# Patient Record
Sex: Female | Born: 1940 | Race: White | Hispanic: No | Smoking: Current every day smoker
Health system: Southern US, Community
[De-identification: ages and names within clinical notes are randomized; demographics above are authoritative.]

## PROBLEM LIST (undated history)

## (undated) ENCOUNTER — Emergency Department (HOSPITAL_COMMUNITY): Payer: Medicare Other | Source: Home / Self Care

## (undated) DIAGNOSIS — Z87440 Personal history of urinary (tract) infections: Secondary | ICD-10-CM

## (undated) DIAGNOSIS — K219 Gastro-esophageal reflux disease without esophagitis: Secondary | ICD-10-CM

## (undated) DIAGNOSIS — L921 Necrobiosis lipoidica, not elsewhere classified: Secondary | ICD-10-CM

## (undated) DIAGNOSIS — K648 Other hemorrhoids: Secondary | ICD-10-CM

## (undated) DIAGNOSIS — H7290 Unspecified perforation of tympanic membrane, unspecified ear: Secondary | ICD-10-CM

## (undated) DIAGNOSIS — M1990 Unspecified osteoarthritis, unspecified site: Secondary | ICD-10-CM

## (undated) DIAGNOSIS — F419 Anxiety disorder, unspecified: Secondary | ICD-10-CM

## (undated) DIAGNOSIS — R55 Syncope and collapse: Secondary | ICD-10-CM

## (undated) DIAGNOSIS — Z872 Personal history of diseases of the skin and subcutaneous tissue: Secondary | ICD-10-CM

## (undated) DIAGNOSIS — K589 Irritable bowel syndrome without diarrhea: Secondary | ICD-10-CM

## (undated) HISTORY — DX: Necrobiosis lipoidica, not elsewhere classified: L92.1

## (undated) HISTORY — PX: VESICOVAGINAL FISTULA CLOSURE W/ TAH: SUR271

## (undated) HISTORY — PX: ABDOMINAL HYSTERECTOMY: SHX81

## (undated) HISTORY — DX: Other hemorrhoids: K64.8

## (undated) HISTORY — DX: Syncope and collapse: R55

## (undated) HISTORY — PX: BUNIONECTOMY: SHX129

## (undated) HISTORY — DX: Anxiety disorder, unspecified: F41.9

## (undated) HISTORY — DX: Unspecified osteoarthritis, unspecified site: M19.90

## (undated) HISTORY — DX: Irritable bowel syndrome without diarrhea: K58.9

## (undated) HISTORY — DX: Personal history of diseases of the skin and subcutaneous tissue: Z87.2

## (undated) HISTORY — DX: Personal history of urinary (tract) infections: Z87.440

## (undated) HISTORY — DX: Unspecified perforation of tympanic membrane, unspecified ear: H72.90

## (undated) HISTORY — DX: Gastro-esophageal reflux disease without esophagitis: K21.9

---

## 1999-01-24 ENCOUNTER — Other Ambulatory Visit: Admission: RE | Admit: 1999-01-24 | Discharge: 1999-01-24 | Payer: Self-pay | Admitting: Internal Medicine

## 2000-03-15 ENCOUNTER — Encounter: Payer: Self-pay | Admitting: Internal Medicine

## 2000-03-15 ENCOUNTER — Encounter: Admission: RE | Admit: 2000-03-15 | Discharge: 2000-03-15 | Payer: Self-pay | Admitting: Internal Medicine

## 2001-03-27 ENCOUNTER — Encounter: Payer: Self-pay | Admitting: Internal Medicine

## 2001-03-27 ENCOUNTER — Encounter: Admission: RE | Admit: 2001-03-27 | Discharge: 2001-03-27 | Payer: Self-pay | Admitting: Internal Medicine

## 2001-04-11 ENCOUNTER — Other Ambulatory Visit: Admission: RE | Admit: 2001-04-11 | Discharge: 2001-04-11 | Payer: Self-pay | Admitting: Internal Medicine

## 2001-10-28 ENCOUNTER — Encounter (INDEPENDENT_AMBULATORY_CARE_PROVIDER_SITE_OTHER): Payer: Self-pay | Admitting: *Deleted

## 2001-10-28 ENCOUNTER — Ambulatory Visit (HOSPITAL_COMMUNITY): Admission: RE | Admit: 2001-10-28 | Discharge: 2001-10-28 | Payer: Self-pay | Admitting: *Deleted

## 2002-02-26 ENCOUNTER — Emergency Department (HOSPITAL_COMMUNITY): Admission: EM | Admit: 2002-02-26 | Discharge: 2002-02-27 | Payer: Self-pay | Admitting: Emergency Medicine

## 2002-04-02 ENCOUNTER — Ambulatory Visit (HOSPITAL_COMMUNITY): Admission: RE | Admit: 2002-04-02 | Discharge: 2002-04-02 | Payer: Self-pay | Admitting: *Deleted

## 2002-04-02 ENCOUNTER — Encounter: Payer: Self-pay | Admitting: Internal Medicine

## 2002-11-20 ENCOUNTER — Other Ambulatory Visit: Admission: RE | Admit: 2002-11-20 | Discharge: 2002-11-20 | Payer: Self-pay | Admitting: Internal Medicine

## 2002-12-25 ENCOUNTER — Encounter: Payer: Self-pay | Admitting: Internal Medicine

## 2002-12-25 ENCOUNTER — Encounter: Admission: RE | Admit: 2002-12-25 | Discharge: 2002-12-25 | Payer: Self-pay | Admitting: Internal Medicine

## 2002-12-31 ENCOUNTER — Ambulatory Visit (HOSPITAL_COMMUNITY): Admission: RE | Admit: 2002-12-31 | Discharge: 2002-12-31 | Payer: Self-pay | Admitting: *Deleted

## 2002-12-31 ENCOUNTER — Encounter (INDEPENDENT_AMBULATORY_CARE_PROVIDER_SITE_OTHER): Payer: Self-pay | Admitting: Specialist

## 2003-07-10 ENCOUNTER — Inpatient Hospital Stay (HOSPITAL_COMMUNITY): Admission: EM | Admit: 2003-07-10 | Discharge: 2003-07-12 | Payer: Self-pay | Admitting: Emergency Medicine

## 2003-07-10 ENCOUNTER — Encounter: Payer: Self-pay | Admitting: *Deleted

## 2003-07-12 ENCOUNTER — Encounter (INDEPENDENT_AMBULATORY_CARE_PROVIDER_SITE_OTHER): Payer: Self-pay | Admitting: Cardiology

## 2004-05-22 ENCOUNTER — Ambulatory Visit (HOSPITAL_COMMUNITY): Admission: RE | Admit: 2004-05-22 | Discharge: 2004-05-22 | Payer: Self-pay | Admitting: Internal Medicine

## 2004-09-25 ENCOUNTER — Ambulatory Visit: Payer: Self-pay | Admitting: Internal Medicine

## 2004-09-26 ENCOUNTER — Ambulatory Visit: Payer: Self-pay | Admitting: Internal Medicine

## 2004-11-14 ENCOUNTER — Ambulatory Visit: Payer: Self-pay | Admitting: Internal Medicine

## 2005-02-23 ENCOUNTER — Ambulatory Visit: Payer: Self-pay | Admitting: Internal Medicine

## 2005-03-23 ENCOUNTER — Ambulatory Visit: Payer: Self-pay | Admitting: Internal Medicine

## 2005-03-30 ENCOUNTER — Ambulatory Visit: Payer: Self-pay | Admitting: Internal Medicine

## 2005-05-24 ENCOUNTER — Ambulatory Visit (HOSPITAL_COMMUNITY): Admission: RE | Admit: 2005-05-24 | Discharge: 2005-05-24 | Payer: Self-pay | Admitting: Internal Medicine

## 2005-08-06 ENCOUNTER — Encounter (INDEPENDENT_AMBULATORY_CARE_PROVIDER_SITE_OTHER): Payer: Self-pay | Admitting: *Deleted

## 2005-08-06 ENCOUNTER — Ambulatory Visit (HOSPITAL_COMMUNITY): Admission: RE | Admit: 2005-08-06 | Discharge: 2005-08-06 | Payer: Self-pay | Admitting: *Deleted

## 2005-08-06 ENCOUNTER — Encounter: Payer: Self-pay | Admitting: Internal Medicine

## 2005-10-08 ENCOUNTER — Encounter: Payer: Self-pay | Admitting: Internal Medicine

## 2006-04-09 ENCOUNTER — Ambulatory Visit: Payer: Self-pay | Admitting: Internal Medicine

## 2006-04-15 ENCOUNTER — Ambulatory Visit: Payer: Self-pay | Admitting: Internal Medicine

## 2007-05-27 ENCOUNTER — Ambulatory Visit (HOSPITAL_COMMUNITY): Admission: RE | Admit: 2007-05-27 | Discharge: 2007-05-27 | Payer: Self-pay | Admitting: Orthopedic Surgery

## 2007-07-21 ENCOUNTER — Ambulatory Visit: Payer: Self-pay | Admitting: Internal Medicine

## 2007-07-21 LAB — CONVERTED CEMR LAB
Alkaline Phosphatase: 70 units/L (ref 39–117)
BUN: 7 mg/dL (ref 6–23)
Basophils Absolute: 0.1 10*3/uL (ref 0.0–0.1)
Basophils Relative: 0.9 % (ref 0.0–1.0)
CO2: 32 meq/L (ref 19–32)
Chloride: 106 meq/L (ref 96–112)
Creatinine, Ser: 0.8 mg/dL (ref 0.4–1.2)
Eosinophils Absolute: 0.2 10*3/uL (ref 0.0–0.6)
Glucose, Bld: 107 mg/dL — ABNORMAL HIGH (ref 70–99)
HCT: 43.4 % (ref 36.0–46.0)
HDL: 43.8 mg/dL (ref >39.0)
Ketones, ur: NEGATIVE mg/dL
Lymphocytes Relative: 17.8 % (ref 12.0–46.0)
MCHC: 34 g/dL (ref 30.0–36.0)
MCV: 87.7 fL (ref 78.0–100.0)
Monocytes Absolute: 0.4 10*3/uL (ref 0.2–0.7)
Monocytes Relative: 5.8 % (ref 3.0–11.0)
Neutrophils Relative %: 72.6 % (ref 43.0–77.0)
Platelets: 368 10*3/uL (ref 150–400)
RBC: 4.95 M/uL (ref 3.87–5.11)
RDW: 13.1 % (ref 11.5–14.6)
Specific Gravity, Urine: 1.015 (ref 1.000–1.03)
TSH: 1.12 microintl units/mL (ref 0.35–5.50)
Total Bilirubin: 0.8 mg/dL (ref 0.3–1.2)
WBC: 6.6 10*3/uL (ref 4.5–10.5)
pH: 6 (ref 5.0–8.0)

## 2007-07-24 ENCOUNTER — Ambulatory Visit: Payer: Self-pay | Admitting: Internal Medicine

## 2007-07-24 ENCOUNTER — Encounter: Payer: Self-pay | Admitting: Internal Medicine

## 2007-07-25 DIAGNOSIS — Z872 Personal history of diseases of the skin and subcutaneous tissue: Secondary | ICD-10-CM

## 2007-07-25 DIAGNOSIS — L409 Psoriasis, unspecified: Secondary | ICD-10-CM

## 2007-07-25 DIAGNOSIS — K219 Gastro-esophageal reflux disease without esophagitis: Secondary | ICD-10-CM

## 2007-07-25 DIAGNOSIS — L988 Other specified disorders of the skin and subcutaneous tissue: Secondary | ICD-10-CM

## 2007-07-25 DIAGNOSIS — M1990 Unspecified osteoarthritis, unspecified site: Secondary | ICD-10-CM | POA: Insufficient documentation

## 2007-07-25 DIAGNOSIS — M810 Age-related osteoporosis without current pathological fracture: Secondary | ICD-10-CM | POA: Insufficient documentation

## 2007-07-25 HISTORY — DX: Personal history of diseases of the skin and subcutaneous tissue: Z87.2

## 2007-07-31 ENCOUNTER — Encounter: Admission: RE | Admit: 2007-07-31 | Discharge: 2007-07-31 | Payer: Self-pay | Admitting: Internal Medicine

## 2008-03-02 ENCOUNTER — Telehealth: Payer: Self-pay | Admitting: Internal Medicine

## 2008-03-02 ENCOUNTER — Ambulatory Visit: Payer: Self-pay | Admitting: Internal Medicine

## 2008-03-02 DIAGNOSIS — F411 Generalized anxiety disorder: Secondary | ICD-10-CM | POA: Insufficient documentation

## 2008-05-25 ENCOUNTER — Telehealth: Payer: Self-pay | Admitting: Internal Medicine

## 2008-05-28 ENCOUNTER — Encounter: Payer: Self-pay | Admitting: Internal Medicine

## 2008-07-14 ENCOUNTER — Ambulatory Visit: Payer: Self-pay | Admitting: Internal Medicine

## 2008-07-14 ENCOUNTER — Telehealth: Payer: Self-pay | Admitting: Internal Medicine

## 2008-07-14 ENCOUNTER — Ambulatory Visit (HOSPITAL_COMMUNITY): Admission: RE | Admit: 2008-07-14 | Discharge: 2008-07-14 | Payer: Self-pay | Admitting: Internal Medicine

## 2008-07-14 LAB — CONVERTED CEMR LAB
AST: 10 units/L (ref 0–37)
Albumin: 3.7 g/dL (ref 3.5–5.2)
Basophils Absolute: 0 10*3/uL (ref 0.0–0.1)
Basophils Relative: 0.1 % (ref 0.0–3.0)
Bilirubin, Direct: 0.2 mg/dL (ref 0.0–0.3)
CO2: 32 meq/L (ref 19–32)
Crystals: NEGATIVE
GFR calc Af Amer: 92 mL/min
Glucose, Bld: 99 mg/dL (ref 70–99)
HDL: 50 mg/dL (ref >39.0)
Hemoglobin: 14 g/dL (ref 12.0–15.0)
Lymphocytes Relative: 21.8 % (ref 12.0–46.0)
MCHC: 33.5 g/dL (ref 30.0–36.0)
MCV: 89.8 fL (ref 78.0–100.0)
Monocytes Absolute: 0.4 10*3/uL (ref 0.1–1.0)
Neutro Abs: 5.9 10*3/uL (ref 1.4–7.7)
Neutrophils Relative %: 71.1 % (ref 43.0–77.0)
Platelets: 394 10*3/uL (ref 150–400)
Potassium: 5.5 meq/L — ABNORMAL HIGH (ref 3.5–5.1)
RDW: 12.8 % (ref 11.5–14.6)
Sodium: 143 meq/L (ref 135–145)
Specific Gravity, Urine: 1.01 (ref 1.000–1.03)
Total Protein, Urine: NEGATIVE mg/dL
Triglycerides: 64 mg/dL (ref 0–149)
Urine Glucose: NEGATIVE mg/dL
VLDL: 13 mg/dL (ref 0–40)

## 2008-07-26 ENCOUNTER — Ambulatory Visit: Payer: Self-pay | Admitting: Internal Medicine

## 2008-09-08 ENCOUNTER — Telehealth: Payer: Self-pay | Admitting: Internal Medicine

## 2009-04-18 ENCOUNTER — Telehealth: Payer: Self-pay | Admitting: Internal Medicine

## 2009-04-21 ENCOUNTER — Ambulatory Visit: Payer: Self-pay | Admitting: Internal Medicine

## 2009-04-21 DIAGNOSIS — R55 Syncope and collapse: Secondary | ICD-10-CM | POA: Insufficient documentation

## 2009-04-21 HISTORY — DX: Syncope and collapse: R55

## 2009-04-22 ENCOUNTER — Observation Stay (HOSPITAL_COMMUNITY): Admission: EM | Admit: 2009-04-22 | Discharge: 2009-04-24 | Payer: Self-pay | Admitting: Emergency Medicine

## 2009-04-22 ENCOUNTER — Ambulatory Visit: Payer: Self-pay | Admitting: Internal Medicine

## 2009-04-22 ENCOUNTER — Telehealth: Payer: Self-pay | Admitting: Family Medicine

## 2009-04-23 ENCOUNTER — Encounter: Payer: Self-pay | Admitting: Internal Medicine

## 2009-04-23 ENCOUNTER — Ambulatory Visit: Payer: Self-pay | Admitting: Internal Medicine

## 2009-04-24 ENCOUNTER — Telehealth (INDEPENDENT_AMBULATORY_CARE_PROVIDER_SITE_OTHER): Payer: Self-pay | Admitting: *Deleted

## 2009-04-24 ENCOUNTER — Encounter: Payer: Self-pay | Admitting: Internal Medicine

## 2009-04-28 ENCOUNTER — Encounter: Payer: Self-pay | Admitting: Internal Medicine

## 2009-04-28 ENCOUNTER — Ambulatory Visit: Payer: Self-pay

## 2009-05-03 ENCOUNTER — Telehealth (INDEPENDENT_AMBULATORY_CARE_PROVIDER_SITE_OTHER): Payer: Self-pay | Admitting: *Deleted

## 2009-05-03 ENCOUNTER — Telehealth: Payer: Self-pay | Admitting: Internal Medicine

## 2009-05-04 ENCOUNTER — Encounter: Payer: Self-pay | Admitting: Internal Medicine

## 2009-05-04 ENCOUNTER — Encounter: Payer: Self-pay | Admitting: Cardiovascular Disease

## 2009-05-04 ENCOUNTER — Ambulatory Visit: Payer: Self-pay

## 2009-05-11 ENCOUNTER — Telehealth: Payer: Self-pay | Admitting: Cardiovascular Disease

## 2009-05-12 ENCOUNTER — Ambulatory Visit: Payer: Self-pay | Admitting: Internal Medicine

## 2009-05-12 ENCOUNTER — Telehealth: Payer: Self-pay | Admitting: Internal Medicine

## 2009-05-12 LAB — CONVERTED CEMR LAB
Hemoglobin, Urine: NEGATIVE
Ketones, ur: NEGATIVE mg/dL
Specific Gravity, Urine: <=1.005 (ref 1.000–1.030)
Total Protein, Urine: NEGATIVE mg/dL
Urine Glucose: NEGATIVE mg/dL
pH: 6.5 (ref 5.0–8.0)

## 2009-05-17 ENCOUNTER — Telehealth: Payer: Self-pay | Admitting: Internal Medicine

## 2009-05-23 ENCOUNTER — Ambulatory Visit: Payer: Self-pay | Admitting: Internal Medicine

## 2009-05-25 ENCOUNTER — Telehealth (INDEPENDENT_AMBULATORY_CARE_PROVIDER_SITE_OTHER): Payer: Self-pay | Admitting: *Deleted

## 2009-05-27 ENCOUNTER — Ambulatory Visit: Payer: Self-pay | Admitting: Internal Medicine

## 2009-05-27 DIAGNOSIS — R002 Palpitations: Secondary | ICD-10-CM | POA: Insufficient documentation

## 2009-06-02 ENCOUNTER — Ambulatory Visit (HOSPITAL_COMMUNITY): Admission: RE | Admit: 2009-06-02 | Discharge: 2009-06-02 | Payer: Self-pay | Admitting: Internal Medicine

## 2009-06-06 ENCOUNTER — Telehealth: Payer: Self-pay | Admitting: Internal Medicine

## 2009-06-09 ENCOUNTER — Telehealth: Payer: Self-pay | Admitting: Internal Medicine

## 2009-06-30 ENCOUNTER — Ambulatory Visit: Payer: Self-pay | Admitting: Internal Medicine

## 2009-06-30 DIAGNOSIS — R197 Diarrhea, unspecified: Secondary | ICD-10-CM

## 2009-07-07 ENCOUNTER — Encounter: Payer: Self-pay | Admitting: Internal Medicine

## 2009-07-07 ENCOUNTER — Ambulatory Visit: Payer: Self-pay | Admitting: Internal Medicine

## 2009-07-27 ENCOUNTER — Telehealth: Payer: Self-pay | Admitting: Internal Medicine

## 2009-07-28 ENCOUNTER — Ambulatory Visit: Payer: Self-pay | Admitting: Internal Medicine

## 2009-08-18 ENCOUNTER — Ambulatory Visit: Payer: Self-pay | Admitting: Internal Medicine

## 2009-08-18 DIAGNOSIS — K589 Irritable bowel syndrome without diarrhea: Secondary | ICD-10-CM

## 2009-08-18 HISTORY — DX: Irritable bowel syndrome, unspecified: K58.9

## 2010-02-06 ENCOUNTER — Telehealth: Payer: Self-pay | Admitting: Internal Medicine

## 2010-02-07 ENCOUNTER — Telehealth (INDEPENDENT_AMBULATORY_CARE_PROVIDER_SITE_OTHER): Payer: Self-pay | Admitting: *Deleted

## 2010-02-21 ENCOUNTER — Encounter: Payer: Self-pay | Admitting: Internal Medicine

## 2010-02-21 ENCOUNTER — Ambulatory Visit (HOSPITAL_COMMUNITY): Admission: RE | Admit: 2010-02-21 | Discharge: 2010-02-21 | Payer: Self-pay | Admitting: Internal Medicine

## 2010-02-21 LAB — HM MAMMOGRAPHY: HM Mammogram: NORMAL

## 2010-02-23 ENCOUNTER — Telehealth: Payer: Self-pay | Admitting: Internal Medicine

## 2010-03-13 ENCOUNTER — Encounter: Payer: Self-pay | Admitting: Internal Medicine

## 2010-03-20 ENCOUNTER — Ambulatory Visit: Payer: Self-pay | Admitting: Internal Medicine

## 2010-03-20 LAB — CONVERTED CEMR LAB
ALT: 11 units/L (ref 0–35)
AST: 14 units/L (ref 0–37)
Alkaline Phosphatase: 61 units/L (ref 39–117)
Basophils Relative: 0.9 % (ref 0.0–3.0)
Bilirubin Urine: NEGATIVE
CO2: 31 meq/L (ref 19–32)
Chloride: 103 meq/L (ref 96–112)
Creatinine, Ser: 0.7 mg/dL (ref 0.4–1.2)
Eosinophils Absolute: 0.3 10*3/uL (ref 0.0–0.7)
Eosinophils Relative: 3.7 % (ref 0.0–5.0)
GFR calc non Af Amer: 89.93 mL/min (ref >60)
LDL Cholesterol: 103 mg/dL — ABNORMAL HIGH (ref 0–99)
Lymphocytes Relative: 24.5 % (ref 12.0–46.0)
Lymphs Abs: 2.1 10*3/uL (ref 0.7–4.0)
MCHC: 34 g/dL (ref 30.0–36.0)
MCV: 88.3 fL (ref 78.0–100.0)
Platelets: 396 10*3/uL (ref 150.0–400.0)
Potassium: 5 meq/L (ref 3.5–5.1)
RBC: 4.73 M/uL (ref 3.87–5.11)
Sodium: 139 meq/L (ref 135–145)
Total CHOL/HDL Ratio: 3
Total Protein: 7.2 g/dL (ref 6.0–8.3)
Triglycerides: 48 mg/dL (ref 0.0–149.0)
Urine Glucose: NEGATIVE mg/dL
Urobilinogen, UA: 0.2 (ref 0.0–1.0)
VLDL: 9.6 mg/dL (ref 0.0–40.0)
WBC: 8.8 10*3/uL (ref 4.5–10.5)

## 2010-03-23 ENCOUNTER — Ambulatory Visit: Payer: Self-pay | Admitting: Internal Medicine

## 2010-05-03 ENCOUNTER — Telehealth: Payer: Self-pay | Admitting: Internal Medicine

## 2010-05-04 ENCOUNTER — Ambulatory Visit: Payer: Self-pay | Admitting: Internal Medicine

## 2010-05-17 ENCOUNTER — Ambulatory Visit: Payer: Self-pay | Admitting: Internal Medicine

## 2010-06-08 ENCOUNTER — Encounter: Payer: Self-pay | Admitting: Internal Medicine

## 2010-07-24 ENCOUNTER — Encounter: Payer: Self-pay | Admitting: Internal Medicine

## 2010-11-28 NOTE — Assessment & Plan Note (Signed)
Summary: STOMACH UP SET/NWS   Vital Signs:  Patient profile:   70 year old female Height:      67 inches Weight:      121 pounds BMI:     19.02 O2 Sat:      95 % on Room air Temp:     97.5 degrees F oral Pulse rate:   103 / minute BP sitting:   136 / 70  (left arm) Cuff size:   regular  Vitals Entered By: Bill Salinas CMA (May 17, 2010 8:57 AM)  O2 Flow:  Room air CC: pt here with c/o chronic diarrhea, nausea and loss of appetite./ ab Comments Pt is due for tetanus shot. She has never had pneumonia or shingles vaccine/ ab   Primary Care Provider:  Illene Regulus, MD  CC:  pt here with c/o chronic diarrhea and nausea and loss of appetite./ ab.  History of Present Illness: Patient presents for continues weight loss, which is profound. She had called 2 weeks ago for stomach upset and was started on Zantac. She did develop diarrhea- 4-5 loose stools during the day. She stopped Zantac and started Peptobismal and mylanta. Calmed her stomach and the loose stools stopped. She describes a thick mucus in her throat in the AM that did seem to respond to allegra.   Reviewed chart - she has seen Dr. 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 She has been ruled out for celiac disease. She was taken off of PPIs as potential exacerbating factor. There is a working diagnosis of IBS. She has been seeing Dr. Wyline Copas for psychiatry.   Current Medications (verified): 1)  Clotrimazole-Betamethasone 1-0.05 %  Crea (Clotrimazole-Betamethasone) .... Two Times A Day As Needed For Exzema Ears 2)  Premarin 0.3 Mg  Tabs (Estrogens Conjugated) .... Once Daily 3)  Naftin-Mp 1 %  Crea (Naftifine Hcl) .... Apply To Feet As Needed 4)  Salex 6 %  Lotn (Salicylic Acid) .... Apply To Legs As Needed 5)  Tranxene-T 7.5 Mg Tabs (Clorazepate Dipotassium) .... 1/2 Tablet By Mouth At Bedtime 6)  Pepto-Bismol 524 Mg/61ml Susp (Bismuth Subsalicylate) .... As Needed 7)  Imodium  Advanced 2-125 Mg Tabs (Loperamide-Simethicone) .... As Needed 8)  Mylanta Gas Relief Maximum Str 125 Mg Caps (Simethicone) .... Use As Needed  Allergies (verified): 1)  ! Tetracycline  Past History:  Past Medical History: Last updated: 08/18/2009 UCD heart murmur-childhhod ruptured eardrum-childhood hemorrhoids-internal hx of UTI necrobiosis lipoidica GERD Osteoarthritis Osteoporosis anxiety postural syncope IBS  Past Surgical History: Last updated: 05/26/2009 Hysterectomy with BSO-'82 bunionectomy with hammertoe correction -'93  Family History: Last updated: 07/24/2009 mother-arthirits Father-died of ruptured AAA paternal GF-MI paternal kinship positive for CAD, DM No FH of Colon Cancer:  Social History: Last updated: 2009/07/24 divorced after 17 yrs marriage 1 daughter, 1 son with mild mental handicap who lives with her 2 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  Alcohol Use - no Daily Caffeine Use: 2 daily   Review of Systems       The patient complains of hemoptysis, severe indigestion/heartburn, and depression.  The patient denies anorexia, fever, weight loss, weight gain, chest pain, syncope, dyspnea on exertion, prolonged cough, hematuria, muscle weakness, transient blindness, and difficulty walking.    Physical Exam  General:  Very thin white female in no acute distress Head:  normocephalic and atraumatic.   Eyes:  C&S clear Neck:  supple.   Chest Wall:  no deformities and  no tenderness.   Lungs:  normal respiratory effort.   Heart:  normal rate and regular rhythm.   Abdomen:  scapoid, BS + x 4 quadrants, no guarding or rebound, no hepatomegaly, no tenderness Msk:  normal ROM and no joint tenderness.   Neurologic:  alert & oriented X3, cranial nerves II-XII intact, strength normal in all extremities, and gait normal.     Impression & Recommendations:  Problem # 1:  IRRITABLE BOWEL SYNDROME (ICD-564.1) Patient with  continued problems with loss of appetite and variable bowel habit. No acute pain and no new symptoms. Concerned for the psychologic overlay as on-going cause for some of her symptoms. Her weight is down 11 lbs since July '10  Plan - D/c pepto, take mylanta before each meal          nutrition - some kind of supplement: e.g. ensure, boost, or carnation instant breakfast          follow-up with her psychiatrist (and request he send records or notes)  Complete Medication List: 1)  Clotrimazole-betamethasone 1-0.05 % Crea (Clotrimazole-betamethasone) .... Two times a day as needed for exzema ears 2)  Premarin 0.3 Mg Tabs (Estrogens conjugated) .... Once daily 3)  Naftin-mp 1 % Crea (Naftifine hcl) .... Apply to feet as needed 4)  Salex 6 % Lotn (Salicylic acid) .... Apply to legs as needed 5)  Tranxene-t 7.5 Mg Tabs (Clorazepate dipotassium) .... 1/2 tablet by mouth at bedtime 6)  Pepto-bismol 524 Mg/61ml Susp (Bismuth subsalicylate) .... As needed 7)  Imodium Advanced 2-125 Mg Tabs (Loperamide-simethicone) .... As needed 8)  Mylanta Gas Relief Maximum Str 125 Mg Caps (Simethicone) .... Use as needed

## 2010-11-28 NOTE — Letter (Signed)
    [facility] [address] [place], [place]  [address] Phone: [phone]      Mar 13, 2010   [facility] [address] [place], [place] [address]  RE:  LAB RESULTS  Dear  [name],  The following is an interpretation of your most recent lab tests.  Please take note of any instructions provided or changes to medications that have resulted from your lab work.    Bone density study reveals osteoporosis at hips, essentially unchanged from '09 study.    Plan is to continue premarin and to take calcium 1200mg   daily and vitamin D [id number] international units daily   Sincerely Yours,    Jacques Navy MD

## 2010-11-28 NOTE — Letter (Signed)
Summary: 05-24-09 to 06-08-10/[facility]  05-24-09 to 06-08-10/[facility]   Imported By: Sherian Rein 06/13/2010 15:07:12  _____________________________________________________________________  External Attachment:    Type:   Image     Comment:   External Document

## 2010-11-28 NOTE — Progress Notes (Signed)
Summary: [facility] pt - [facility] TOMORROW  Phone Note Call from Patient   Summary of Call: Pt called concerned about nausea and weakness. C/o recent weight loss but has spoken w/PCP recently regarding this. Pt feels hungry but food has caused nausea. She is keeping a bland diet and has been using pepto bismol. Advised per last office visit to try H2 blocker. She has not done this but will try today. Also will continue bland small frequent meals, clear liquids and aviod dairy and high acid foods/liquids. Scheduled for office visit tomorow w/Dr Felicity Coyer. Pt will call office w/any changes if symptoms or further concerns prior to office visit.  Initial call taken by: Lamar Sprinkles, CMA,  May 03, 2010 12:09 PM  Follow-up for Phone Call        noted, agree as advised - will f/u at OV tomorrow Follow-up by: Newt Lukes MD,  May 03, 2010 12:35 PM

## 2010-11-28 NOTE — Progress Notes (Signed)
Summary: BONE DENSITY   Phone Note Call from Patient   Summary of Call: Patient is requesting referral for bone density. Last was 06/2008, report reccomends repeat in 2 years.  Initial call taken by: Lamar Sprinkles, CMA,  February 06, 2010 12:04 PM  Follow-up for Phone Call        ok for DXA. Schedullers notified Follow-up by: Jacques Navy MD,  February 07, 2010 5:38 AM

## 2010-11-28 NOTE — Progress Notes (Signed)
Summary: REFERRAL  Phone Note Call from Patient   Summary of Call: Pt [facility] only needs referral faxed to [phone] Initial call taken by: Lamar Sprinkles, CMA,  February 07, 2010 5:39 PM  Follow-up for Phone Call        [facility] notified Follow-up by: Jacques Navy MD,  February 08, 2010 5:05 AM  Additional Follow-up for Phone Call Additional follow up Details #1::          faxed  signed referral [phone] Shelbie Proctor  February 10, 2010 3:03 PM

## 2010-11-28 NOTE — Letter (Signed)
Summary: Ollen Gross PHD  Ollen Gross PHD   Imported By: Lennie Odor 08/10/2010 14:46:09  _____________________________________________________________________  External Attachment:    Type:   Image     Comment:   External Document

## 2010-11-28 NOTE — Progress Notes (Signed)
    Preventive Care Screening  Bone Density:    Date:  02/21/2010    Results:  abnormal

## 2010-11-28 NOTE — Assessment & Plan Note (Signed)
Summary: cpx-lb   Vital Signs:  Patient profile:   70 year old female Height:      67 inches Weight:      126 pounds BMI:     19.81 O2 Sat:      95 % on Room air Temp:     96.8 degrees F oral Pulse rate:   102 / minute BP sitting:   120 / 80  (left arm) Cuff size:   regular  Vitals Entered By: Bill Salinas CMA (Mar 23, 2010 10:02 AM)  O2 Flow:  Room air CC: CPX/ pt needs refills on meds/ ab   Primary Care Provider:  Illene Regulus, MD  CC:  CPX/ pt needs refills on meds/ ab.  History of Present Illness: 7 lbs weight loss: loss of appetite, loss of taste. She has started a multi-vit. She does report diarrhea has resolved with stopping nexium. She does [place] occasional mylanta. She had colonoscopy in September '09 - diagnosed with lymphocytic colitis. she never did need prednisone treatment and the diarrhea did resolve.   She still sees psychiatrist - Dr. Ilsa Iha, at [facility] and he is prescribing tranxene - 7.5 mg at bedtime and 1/2 tab as needed.  Reviewed Cardiology notes. There was a concern on  2D ehco of enlargement of the aortic root. MRA was performed Oct '10 - aortic root 3.3 cm - normal. The rest of the aorta was normal. Dr. Johney Frame felt there was no need for further evaluation of syncope.   Derm - a skin change on the foot at the side of the foot and heel. SHe does get a good response to Naftan, prescribed by dermatologist. She has eczema of the ear canal and uses  clogtrimazole/betamethazone as needed. 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 She is having alopecia and change in scalp texture. She questions if this is hair coloring. She did call Dr. Venancio Poisson about this - Rx for fluocinonide topical 0.005% soln to scalp. She treated this x 3 and it seems to be better. She does have a persistent "skin tag" vs benign mole that had been treated but did not resolve.    she c/o facial pain left involving zygom,  periauricular area. 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   Current Medications (verified): 1)  Clotrimazole-Betamethasone 1-0.05 %  Crea (Clotrimazole-Betamethasone) .... Two Times A Day As Needed For Exzema Ears 2)  Premarin 0.3 Mg  Tabs (Estrogens Conjugated) .... Once Daily 3)  Naftin-Mp 1 %  Crea (Naftifine Hcl) .... Apply To Feet As Needed 4)  Salex 6 %  Lotn (Salicylic Acid) .... Apply To Legs As Needed 5)  Tranxene-T 7.5 Mg Tabs (Clorazepate Dipotassium) .Marland Kitchen.. 1 Tablet By Mouth At Bedtime 6)  Pepto-Bismol 524 Mg/11ml Susp (Bismuth Subsalicylate) .... As Needed 7)  Imodium Advanced 2-125 Mg Tabs (Loperamide-Simethicone) .... As Needed 8)  Mylanta Gas Relief Maximum Str 125 Mg Caps (Simethicone) .... Use As Needed  Allergies (verified): 1)  ! Tetracycline  Past History:  Past Medical History: Last updated: 08/18/2009 UCD heart murmur-childhhod ruptured eardrum-childhood hemorrhoids-internal hx of UTI necrobiosis lipoidica GERD Osteoarthritis Osteoporosis anxiety postural syncope IBS  Past Surgical History: Last updated: 05/26/2009 Hysterectomy with BSO-'82 bunionectomy with hammertoe correction -'93  Family History: Last updated: 07-13-09 mother-arthirits Father-died of ruptured AAA paternal GF-MI paternal kinship positive for CAD, DM No FH of Colon Cancer:  Social History: Last updated: July 13, 2009 divorced after 17  yrs marriage 1 daughter, 1 son with mild mental handicap who lives with her 2 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  Alcohol Use - no Daily Caffeine Use: 2 daily   Risk Factors: Alcohol Use: <1 (07/24/2007) Caffeine Use: 2 (07/26/2008) Exercise: no (07/26/2008)  Risk Factors: Smoking Status: current (06/30/2009) Packs/Day: 1 (07/24/2007)  Review of Systems  The patient denies anorexia, fever, weight loss, weight gain, vision loss, decreased hearing, hoarseness, chest pain, syncope,  dyspnea on exertion, peripheral edema, prolonged cough, hemoptysis, abdominal pain, hematochezia, severe indigestion/heartburn, incontinence, genital sores, muscle weakness, suspicious skin lesions, difficulty walking, unusual weight change, abnormal bleeding, angioedema, and breast masses.    Physical Exam  General:  alert and well-developed, underweight white female. A bit anxious.   Head:  normocephalic, atraumatic, and no abnormalities observed.   Eyes:  vision grossly intact, pupils equal, pupils round, and corneas and lenses clear.   Ears:  R ear normal, L ear normal, and no external deformities.   Nose:  no external deformity and no external erythema.   Mouth:  good dentition, no gingival abnormalities, no dental plaque, and pharynx pink and moist.   Neck:  supple, full ROM, no thyromegaly, and no thyroid nodules or tenderness.   Chest Wall:  no deformities and no tenderness.   Breasts:  skin/areolae normal, no masses, no abnormal thickening, no nipple discharge, and no tenderness.   Lungs:  normal respiratory effort, no intercostal retractions, no dullness, no crackles, and no wheezes.   Heart:  normal rate, regular rhythm, no murmur, no gallop, no JVD, physiological split S2, no lifts, and no thrills.   Abdomen:  soft, non-tender, normal bowel sounds, no masses, no guarding, no rigidity, no abdominal hernia, no hepatomegaly, and no splenomegaly.   Genitalia:  deferred Msk:  normal ROM, no joint tenderness, no joint swelling, no redness over joints, and no joint instability.   Pulses:  2+ radial pulses, 1+ DP Extremities:  No clubbing, cyanosis, edema, or deformity noted with normal full range of motion of all joints.   Neurologic:  alert & oriented X3, cranial nerves II-XII intact, strength normal in all extremities, gait normal, and DTRs symmetrical and normal.   Skin:  turgor normal, color normal, no rashes, no petechiae, and no ulcerations.   Cervical Nodes:  no anterior cervical  adenopathy and no posterior cervical adenopathy.   Axillary Nodes:  no R axillary adenopathy and no L axillary adenopathy.   Inguinal Nodes:  no R inguinal adenopathy and no L inguinal adenopathy.   Psych:  Oriented X3, memory intact for recent and remote, normally interactive, good eye contact, and not anxious appearing.     Impression & Recommendations:  Problem # 1:  IRRITABLE BOWEL SYNDROME (ICD-564.1) Appears to be stable with no complaints today of problems.  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 Patient had a 3.3 cm aortic root - no follow-up needed. She had an otherwise normal aorta without aneurysm. No need for further follow-up.  Problem # 3:  SYNCOPE (ICD-780.2) See hospital d/c. No need for further eval.   Plan - eat regular meals           keep well hydrated, especially when working in the garden on hot days.   Problem # 4:  ANXIETY STATE, UNSPECIFIED (ICD-300.00) Appears to be stable on low dose tranxene. Her medications are managed by Dr. Ilsa Iha.  Her  updated medication list for this problem includes:    Tranxene-t 7.5 Mg Tabs (Clorazepate dipotassium) .Marland Kitchen... 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 This problem is stable as long as she uses her salix.  Problem # 6:  ECZEMA, HX OF (ICD-V13.3) Stable on topical steroid cream. She should return to dermatology as needed.  Problem # 7:  OSTEOPOROSIS (ICD-733.00) Reviewed DXAQ from April 26th '11: right femur score -2.7 (12.6 '09), left femur -3.0 (-2.8 '09): mild progressive disease.  Plan - continue premarin           calcium 11200mg  daily and Vit D [id number] international units daily.  Problem # 8:  OSTEOARTHRITIS (ICD-715.90) stable with no active inflammation of joints.  Problem # 9:  GERD (ICD-530.81) Stable with patient  voicing no active complaints. she may use over the counter H2 blockers, e.g. ranitidine, as needed.   Problem # 10:  Preventive Health Care (ICD-V70.0) Normal exam today. Lab results are very stable including an LDL (bad) cholesterol of 103, normal blood sugar. Last mammogram May 23rd '11 - ok; last colonoscopy Sept 9th'10 no polyps or premalignant lesions - due for followup 2015. She is a candidate for immunizations: tetnus, pneumonia vaccine and shingles vaccine. She remains very active. Although anxious she does not appear to be suffering depression.   In summary - a pleasant woman who appears medically stable. Refill Rxs provided. Her weight loss does not appear pathologic - she is encouraged to take in extra calories, ie.  milkshakes or ice-cream or other calorie supplement daily. She will return as needed.   Complete Medication List: 1)  Clotrimazole-betamethasone 1-0.05 % Crea (Clotrimazole-betamethasone) .... Two times a day as needed for exzema ears 2)  Premarin 0.3 Mg Tabs (Estrogens conjugated) .... Once daily 3)  Naftin-mp 1 % Crea (Naftifine hcl) .... Apply to feet as needed 4)  Salex 6 % Lotn (Salicylic acid) .... Apply to legs as needed 5)  Tranxene-t 7.5 Mg Tabs (Clorazepate dipotassium) .Marland Kitchen.. 1 tablet by mouth at bedtime 6)  Pepto-bismol 524 Mg/58ml Susp (Bismuth subsalicylate) .... As needed 7)  Imodium Advanced 2-125 Mg Tabs (Loperamide-simethicone) .... As needed 8)  Mylanta Gas Relief Maximum Str 125 Mg Caps (Simethicone) .... Use as needed  Other Orders: Subsequent annual wellness visit with prevention plan (O1308)  Patient: Earvin Hansen Note: All result statuses are Final unless otherwise noted.  Tests: (1) Lipid Panel (LIPID)   Cholesterol               160 mg/dL                   6-578     ATP III Classification            Desirable:  < 200 mg/dL                    Borderline High:  200 - 239 mg/dL               High:  > = 240 mg/dL   Triglycerides              48.0 mg/dL                  4.6-962.9     Normal:  <150 mg/dL     Borderline High:  528 - 199 mg/dL   HDL  47.50 mg/dL                 >16.10   VLDL Cholesterol          9.6 mg/dL                   9.6-04.5   LDL Cholesterol      [H]  409 mg/dL                   8-11  CHO/HDL Ratio:  CHD Risk                             3                    Men          Women     1/2 Average Risk     3.4          3.3     Average Risk          5.0          4.4     2X Average Risk          9.6          7.1     3X Average Risk          15.0          11.0                           Tests: (2) BMP (METABOL)   Sodium                    139 mEq/L                   135-145   Potassium                 5.0 mEq/L                   3.5-5.1   Chloride                  103 mEq/L                   96-112   Carbon Dioxide            31 mEq/L                    19-32   Glucose                   92 mg/dL                    91-47   BUN                       10 mg/dL                    8-29   Creatinine                0.7 mg/dL                   5.6-2.1   Calcium                   8.8 mg/dL  8.4-10.5   GFR                       89.93 mL/min                >60  Tests: (3) CBC Platelet w/Diff (CBCD)   White Cell Count          8.8 K/uL                    4.5-10.5   Red Cell Count            4.73 Mil/uL                 3.87-5.11   Hemoglobin                14.2 g/dL                   16.1-09.6   Hematocrit                41.7 %                      36.0-46.0   MCV                       88.3 fl                     78.0-100.0   MCHC                      34.0 g/dL                   04.5-40.9   RDW                       13.6 %                      11.5-14.6   Platelet Count            396.0 K/uL                  150.0-400.0   Neutrophil %              65.6 %                      43.0-77.0   Lymphocyte %              24.5 %                      12.0-46.0   Monocyte %                5.3 %                        3.0-12.0   Eosinophils%              3.7 %                       0.0-5.0   Basophils %               0.9 %                       0.0-3.0   Neutrophill Absolute  5.8 K/uL                    1.4-7.7   Lymphocyte Absolute       2.1 K/uL                    0.7-4.0   Monocyte Absolute         0.5 K/uL                    0.1-1.0  Eosinophils, Absolute                             0.3 K/uL                    0.0-0.7   Basophils Absolute        0.1 K/uL                    0.0-0.1  Tests: (4) Hepatic/Liver Function Panel (HEPATIC)   Total Bilirubin           0.4 mg/dL                   0.1-0.2   Direct Bilirubin          0.1 mg/dL                   7.2-5.3   Alkaline Phosphatase      61 U/L                      39-117   AST                       14 U/L                      0-37   ALT                       11 U/L                      0-35   Total Protein             7.2 g/dL                    6.6-4.4   Albumin                   3.8 g/dL                    0.3-4.7  Tests: (5) TSH (TSH)   FastTSH                   1.36 uIU/mL                 0.35-5.50  Tests: (6) UDip w/Micro (URINE)   Color                     Yellow       RANGE:  Yellow;Lt. Yellow   Clarity                   CLEAR                       Clear   Specific Gravity  1.015                       1.000 - 1.030   Urine Ph                  7.0                         5.0-8.0   Protein                   NEGATIVE                    Negative   Urine Glucose             NEGATIVE                    Negative   Ketones                   NEGATIVE                    Negative   Urine Bilirubin           NEGATIVE                    Negative   Blood                     TRACE-INTACT                Negative   Urobilinogen              0.2                         0.0 - 1.0   Leukocyte Esterace        SMALL                       Negative   Nitrite                   NEGATIVE                    Negative   Urine WBC                  3-6/hpf                     0-2/hpf   Urine RBC                 0-2/hpf                     0-2/hpf   Urine Epith               Many(>10/hpf)               Rare(0-4/hpf)   Urine Bacteria            Rare(<10/hpf)               NonePrescriptions: SALEX 6 %  LOTN (SALICYLIC ACID) apply to legs as needed  #4oz x 3   Entered and Authorized by:   Jacques Navy MD   Signed by:   Jacques Navy MD on 03/23/2010   Method used:   Electronically to        [facility]* (retail)  [address]       [place], [place]  [id number]       Ph: [phone]       Fax: [phone]   RxID:   [id number] NAFTIN-MP 1 %  CREA (NAFTIFINE HCL) apply to feet as needed  #15g x 3   Entered and Authorized by:   Jacques Navy MD   Signed by:   Jacques Navy MD on 03/23/2010   Method used:   Electronically to        [facility]* (retail)       [address]       [place], [place]  [address]       Ph: [phone]       Fax: [phone]   RxID:   [id number] PREMARIN 0.3 MG  TABS (ESTROGENS CONJUGATED) once daily  #30 Each x 12   Entered and Authorized by:   Jacques Navy MD   Signed by:   Jacques Navy MD on 03/23/2010   Method used:   Electronically to        [facility]* (retail)       [address]       [place], [place]  [address]       Ph: [phone]       Fax: [phone]   RxID:   [id number] CLOTRIMAZOLE-BETAMETHASONE 1-0.05 %  CREA (CLOTRIMAZOLE-BETAMETHASONE) two times a day as needed for exzema ears  #15g x 3   Entered and Authorized by:   Jacques Navy MD   Signed by:   Jacques Navy MD on 03/23/2010   Method used:   Electronically to        [facility]* (retail)       [address]       [place], [place]  [id number]       Ph: [phone]       Fax: [phone]   RxID:   [id number]    Preventive Care Screening  Mammogram:    Date:  02/21/2010    Results:  normal  bilateral

## 2011-02-05 LAB — DIFFERENTIAL
Basophils Absolute: 0.1 10*3/uL (ref 0.0–0.1)
Basophils Relative: 1 % (ref 0–1)
Eosinophils Absolute: 0.1 10*3/uL (ref 0.0–0.7)
Neutro Abs: 6.2 10*3/uL (ref 1.7–7.7)
Neutrophils Relative %: 70 % (ref 43–77)

## 2011-02-05 LAB — URINALYSIS, ROUTINE W REFLEX MICROSCOPIC
Bilirubin Urine: NEGATIVE
Glucose, UA: NEGATIVE mg/dL
Nitrite: NEGATIVE
Specific Gravity, Urine: 1.012 (ref 1.005–1.030)
pH: 6 (ref 5.0–8.0)

## 2011-02-05 LAB — CBC
HCT: 45.8 % (ref 36.0–46.0)
MCV: 89 fL (ref 78.0–100.0)
Platelets: 364 10*3/uL (ref 150–400)
RDW: 13.4 % (ref 11.5–15.5)
WBC: 8.8 10*3/uL (ref 4.0–10.5)

## 2011-02-05 LAB — COMPREHENSIVE METABOLIC PANEL
Alkaline Phosphatase: 70 U/L (ref 39–117)
CO2: 25 mEq/L (ref 19–32)
GFR calc non Af Amer: >60 mL/min (ref >60)
Glucose, Bld: 118 mg/dL — ABNORMAL HIGH (ref 70–99)
Potassium: 3.6 mEq/L (ref 3.5–5.1)

## 2011-02-05 LAB — URINE MICROSCOPIC-ADD ON

## 2011-02-05 LAB — CK TOTAL AND CKMB (NOT AT ARMC)
CK, MB: 0.8 ng/mL (ref 0.3–4.0)
Total CK: 37 U/L (ref 7–177)

## 2011-02-05 LAB — POCT CARDIAC MARKERS
CKMB, poc: <1 ng/mL — ABNORMAL LOW (ref 1.0–8.0)
Myoglobin, poc: 51.6 ng/mL (ref 12–200)
Troponin i, poc: <0.05 ng/mL (ref 0.00–0.09)
Troponin i, poc: <0.05 ng/mL (ref 0.00–0.09)

## 2011-02-05 LAB — URINE CULTURE: Special Requests: NEGATIVE

## 2011-02-05 LAB — APTT: aPTT: 32 seconds (ref 24–37)

## 2011-02-05 LAB — PROTIME-INR: INR: 1 (ref 0.00–1.49)

## 2011-03-02 ENCOUNTER — Other Ambulatory Visit: Payer: Self-pay | Admitting: Internal Medicine

## 2011-03-02 DIAGNOSIS — Z1231 Encounter for screening mammogram for malignant neoplasm of breast: Secondary | ICD-10-CM

## 2011-03-13 NOTE — H&P (Signed)
[name] NO.:  [id number]   MEDICAL RECORD NO.:  [id number]          PATIENT TYPE:  EMS   LOCATION:  MAJO                         FACILITY:  [facility]   PHYSICIAN:  [name], MDDATE OF BIRTH:  12-03-1940   DATE OF ADMISSION:  04/22/2009  DATE OF DISCHARGE:                              HISTORY & PHYSICAL   CHIEF COMPLAINT:  Weakness and near syncope.   HISTORY OF PRESENT ILLNESS:  The patient is a 70 year old female, who  was stable until 5 days prior to admission.  At that time, she  experienced an episode where she became quite flushed, weak and dizzy.  She felt that she was on the verge of passing out, but sat down, placed  her head between her knees and symptoms resolved after several minutes.  Throughout the week, she has been slightly weak but was stable until the  evening of admission when she had another similar episode.  She again  stated that she had a rather diffuse, warm, flushed sensation associated  with profound weakness and lightheadedness.  She again sat down, placed  her head between her knees and symptoms resolved after several minutes.  There is no diaphoresis, nausea, or vomiting.  The patient was evaluated  by her primary care [name] yesterday following her episode earlier in  the week.  She was placed on atenolol 25 mg daily.  The patient was  hospitalized in September 2004 also for near syncopal episode.  At that  time, there was some transient bradycardia noted in the emergency  department.  Evaluation included carotid artery Doppler studies that  revealed a 40-60% right ICA stenosis.  A 2-D echocardiogram was normal  with normal LV function.  A subsequent outpatient stress test performed  by Dr. Lucas Bryan was also normal in 2004.  Evaluation in the emergency  department was fairly unremarkable.  The patient's blood pressure was  stable, at times slightly high.  Pulse rate ranged from 70-90, O2  saturation 96%.  Initial  cardiac markers were negative.  Laboratory  studies were unremarkable except for urinalysis that revealed pyuria.  The patient is now admitted for further evaluation and treatment of her  near syncope.   PAST MEDICAL HISTORY:  As mentioned, the patient was hospitalized in  September 2004 following a near syncopal episode.  She has  gastroesophageal reflux disease and possible Barrett esophagus.  She has  had upper endoscopies in 2002, 2004, and 2006 by Dr. Virginia Bryan.  She has  osteoarthritis, ongoing tobacco use.  She has an anxiety disorder and a  history of osteoporosis.  Surgical procedures have included a prior  hysterectomy and bunionectomy.   SOCIAL HISTORY:  She is divorced.  Current smoker.  Nondrinker.  She is  a 1-1/2 pack per day smoker.  She does reside at home with a mentally  retarded son.   FAMILY HISTORY:  Father died of complications of a ruptured aortic  aneurysm at age 91.  Mother died at 53 with a history of hypertension.   PHYSICAL EXAMINATION:  VITAL SIGNS:  Blood pressure 140/80 and pulse  rate 80.  SKIN:  Warm and dry without rash.  HEENT:  Normal pupil responses.  Conjunctivae clear.  ENT normal.  NECK:  No audible bruits.  CHEST:  Clear.  CARDIOVASCULAR:  Normal S1 and S2.  No murmurs.  ABDOMEN:  Benign.  No organomegaly or masses.  No bruits appreciated.  EXTREMITIES:  Full pedal pulses.  No edema.  NEUROLOGIC:  Nonfocal.   IMPRESSION:  1. Near syncope probably vasovagal.  We will admit to the hospital to      a telemetry setting to rule out cardiac arrhythmia.  We will      consider Cardiology consultation.  2. Urinary tract infection.  We will check a urine C and S, and place      on Cipro.   ADDITIONAL DIAGNOSES:  1. History of right moderate internal carotid artery stenosis.  We      will repeat a carotid artery Doppler ultrasound.  2. Anxiety disorder.  3. Degenerative join disease.  4. Gastroesophageal reflux disease.   DISPOSITION:  We will  admit to a telemetry setting.  We will cycle some  additional enzymes.  Urine culture will be obtained and she will be  placed on Cipro 500 mg b.i.d.  Repeat carotid artery Doppler ultrasound  will be reviewed.      Emma Savers, MD  Electronically Signed     Emma Savers, MD  Electronically Signed    PFK/MEDQ  D:  04/22/2009  T:  04/23/2009  Job:  [id number]

## 2011-03-13 NOTE — Discharge Summary (Signed)
[name], [name]               ACCOUNT NO.:  [id number]   MEDICAL RECORD NO.:  [id number]          PATIENT TYPE:  INP   LOCATION:  3733                         FACILITY:  [facility]   PHYSICIAN:  Emma Gess. Norins, MD  DATE OF BIRTH:  19-Dec-1940   DATE OF ADMISSION:  04/22/2009  DATE OF DISCHARGE:  04/24/2009                               DISCHARGE SUMMARY   ADMITTING DIAGNOSIS:  Syncope.   DISCHARGE DIAGNOSIS:  Near syncopal episode secondary to volume  depletion.   CONSULTANTS:  Emma Range, MD, for Electrophysiology.   PROCEDURES:  1. A 2-view chest x-ray on the day of admission, which showed no acute      findings.  2. Telemetry for her entire hospital stay, which showed no arrhythmia.   HISTORY OF PRESENT ILLNESS:  The patient is a 70 year old woman who had  a history in 2004 of significant syncope, actually near syncope at that  time.  She was fully evaluated and worked up by Dr. Higinio Bryan and Dr.  Aggie Bryan including negative stress test, negative Holter monitor,  negative carotid Dopplers, which did show 40-60% right ICA stenosis.  A  2-D echocardiogram with normal LV function.   The patient was seen on April 21, 2009, for evaluation of near syncopal  episode.  At that time, her evaluation was unremarkable.  Bryan was for  her to be seen as an outpatient by Electrophysiology.  She was started  on atenolol 25 mg once daily because of tachyarrhythmia.   On the day of admission, the patient reports she had an episode where  she was in the kitchen preparing for supper, she became flushed, weak,  dizzy, and felt she was going to pass out and had a sense of  overwhelming doom.  She sat down, placed her head between her knees, and  her symptoms resolved after several minutes.  She call the on-call  physician, Dr. Tawanna Bryan who recommended she come to the emergency department  for further evaluation and subsequent admission.   Please see the H and P as well at the office EMR  note of April 21, 2009,  for past medical history, family history, social history.   HOSPITAL COURSE:  The patient was admitted to a telemetry unit.  She had  no arrhythmia while on telemetry.  Cardiac enzymes were cycled and were  negative.  The patient's laboratory evaluation was unremarkable except  for a positive urinalysis.  BUN and creatinine was 7 and 0.74.  Sodium  was 133, potassium was 3.6.  The patient remained asymptomatic during  her hospital stay.   The patient was seen in consultation by Dr. Johney Bryan.  His primary  diagnosis was volume depletion secondary to inadvertent dehydration.  He  felt that the patient due to her smoking habit would be a candidate for  Myoview stress testing as an outpatient.  There may be further  consideration for LifeWatch event monitor as an outpatient if there is  any additional questions of arrhythmia.  The patient would also need to  be scheduled as an outpatient for carotid Dopplers given that she  did  have 40-60% ICA stenosis in 2004.   With the patient having no recurrent syncope, with her having normal  telemetry with no arrhythmia, with consultation being done with an  outpatient Bryan for further evaluation and followup established, she is  now ready for discharge.   1. UTI.  The patient did have a positive urinalysis at the time of      admission.  Urine culture was pending at the time of discharge.      The patient was started on ciprofloxacin 250 mg b.i.d. and this      will be continued as an outpatient for 4 additional days.   1. Tobacco abuse.  The patient does continue to smoke.  She has been      advised by Dr. Johney Bryan as well as this dictator to stop smoking.  At      this point, she is precontemplative.  We will follow up as an      outpatient for further assistance with smoking cessation.   DISCHARGE EXAMINATION:  VITAL SIGNS:  Temperature 97.9, blood pressure  116/81, heart rate 60, respirations were 18, O2 sats 100% on  room air.  Orthostatic vital signs from April 23, 2009, with a blood pressure of  90/60 supine, 100/62 sitting, 105/60 standing.   Laboratory pending at the time of discharge is random a.m. cortisol  level and a TSH.   DISPOSITION:  The patient is discharged home.  She will continue on  ciprofloxacin for 5 additional days as noted.  She has been transitioned  from Tranxene to Xanax for control of her anxiety.  Atenolol is  discontinued.  Nexium is discontinued.  The patient will be taking  ranitidine 150 mg p.o. b.i.d. for GI distress.    She will be contacted by the Cardiology Office in regards to [facility]  testing.  Her primary care office will schedule her for carotid  Dopplers.  She will be seen in follow up by primary care in 4 weeks, at  which time we will review her studies and also re-discuss smoking  cessation.  The patient's condition at the time of discharge dictation  is stable and improved.      Emma Gess Norins, MD  Electronically Signed     MEN/MEDQ  D:  04/24/2009  T:  04/24/2009  Job:  [id number]   cc:   Emma Range, MD

## 2011-03-13 NOTE — Consult Note (Signed)
[name], [name]               ACCOUNT NO.:  [id number]   MEDICAL RECORD NO.:  [id number]          PATIENT TYPE:  INP   LOCATION:                               FACILITY:  [facility]   PHYSICIAN:  Hillis Range, MD       DATE OF BIRTH:  Dec 05, 1940   DATE OF CONSULTATION:  DATE OF DISCHARGE:                                 CONSULTATION   CONSULTING PHYSICIAN:  Rosalyn Gess. Norins, MD   REASON FOR CONSULTATION:  Presyncope.   HISTORY OF PRESENT ILLNESS:  [name] is a pleasant 70 year old female  with a history of anxiety, irritable bowel syndrome, and GERD who has  now admitted with symptoms of presyncope.  The patient reports that her  first presyncopal episode occurred in 2004.  At that time, she reports  that was middle of the summer and she had been standing when she  developed an acute warmth with a sinking feeling.  She reports  hyperventilation with a numbness throughout her extremities.  She was  evaluated with an echocardiogram and stress test, which were  unremarkable.  She was felt to have vasovagal syncope.  She reports  doing very well with no further episodes of presyncope and told April 17, 2009.  She reports that she was in the kitchen and had been walking  around and sitting on a barstool.  She stood to serve her son and  developed an acute warmth and sinking sensation.  She sat down and put  her head between her legs and her symptoms resolved within 1-2 minutes.  She denies palpitations, chest discomfort, shortness of breath heart  racing or other symptoms with the event.  She reports feeling rather  weak and washed out throughout the rest of the day.  She also reports  being very fearful.  She was evaluated by Dr. Debby Bud this week who  placed the patient on atenolol 25 mg daily.  The patient reports that  her mother died suddenly in her sleep after being treated with  adjustments to her blood pressure medicines and she reports some  anxieties about low blood  pressure.  She reports feeling nevertheless  that her usual state of health yesterday when she was walking in the  kitchen serving her son at approximately 7:00 p.m.  She again developed  the same warmth with mild nausea.  She reports minimal presyncopal  symptoms, which resolved within 1-2 minutes after putting her head  between her legs.  Due to concerns and fears regarding this episode, she  presented to [facility] and was admitted for further evaluation.  Presently she reports feeling mildly wobbly, but is otherwise in her  normal state of health.  She denies chest pain, shortness of breath,  orthopnea, PND, palpitations, or syncope.  She is otherwise without  complaint at this time.   PAST MEDICAL HISTORY:  1. Irritable bowel syndrome.  2. Anxiety.  3. Ongoing tobacco use.  4. Prior vasovagal syncope.  5. Gastroesophageal reflux disease with Barrett's esophagus.  6. History of irregular heartbeat  7. Degenerative arthritis.  8. Osteoporosis.  PAST SURGICAL HISTORY:  1. Status post hysterectomy.  2. Status post bunionectomy.   ALLERGIES:  TETRACYCLINE.   MEDICATIONS:  Presently, the patient's medicine list is reviewed in the  [facility].   SOCIAL HISTORY:  The patient lives with her son who is mentally  retarded.  She is retired.  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  She has a longstanding history of tobacco and  continues to smoke quite frequently.  She denies alcohol or drug use.   FAMILY HISTORY:  The patient's mother died in her sleep at age 53.  Her  father died of an aortic aneurysm rupture.  She denies any other history  of sudden death or arrhythmias, though she has siblings with irregular  heart beats.   REVIEW OF SYSTEMS:  All systems were reviewed and negative except as  outlined in the HPI above and is documented below.  The patient reports  losing 10 pounds  over the past 9 months with decreased appetite.  She  reports chronic loose watery stools with multiple loose stools each day.  She also reports significant anxiety.   Telemetry reveals sinus rhythm with no arrhythmias documented.  There  are no significant bradycardia or pauses.   PHYSICAL EXAMINATION:  VITAL SIGNS:  Blood pressure 106/69, heart rate  66, respirations 21, sats 97% on room air, afebrile.  GENERAL:  The patient is a thin, elderly female in no acute distress.  She is alert and oriented x3.  HEENT:  Normocephalic, atraumatic.  Sclerae clear.  Conjunctivae pink.  Oropharynx clear with dry mucous membranes.  NECK:  Supple.  JVP flat.  LUNGS:  Clear.  HEART:  Regular rate and rhythm.  No murmurs, rubs, or gallops.  GI:  Soft, nontender, nondistended.  Positive bowel sounds.  EXTREMITIES:  No clubbing, cyanosis, or edema.  The patient has venous  stasis changes along both lower extremities.  NECK:  No lacerations; however, she does have venous stasis changes.  MUSCULOSKELETAL:  Diffuse muscle atrophy.  PSYCHOLOGY:  Anxious mood.  NEUROLOGIC:  Cranial nerves II through XII are intact.  Strength and  sensation are intact.   EKG reveals sinus rhythm at 65 beats per minute with an RSR prime  pattern.  The QT intervals 440 milliseconds.  An EKG is otherwise  unremarkable.   LABORATORY DATA:  Troponin 0.02, CK-MB 0.8, CK 37, sodium 133, potassium  3.6, glucose 118, BUN 7, creatinine 0.7, INR 1.  White blood cell count  8.8, hematocrit 45, platelets 346.  Urinalysis reveals large leukocytes.   A review of the patient's echocardiogram from 2004, which revealed left  ventricular ejection fraction of 55-60% with normal left ventricular  wall motion and no significant valvular abnormalities.   I have also discussed the patient's care with Dr. Debby Bud.   IMPRESSION:  [name] is a pleasant 70 year old female with a history  of anxiety, irritable bowel syndrome with frequent  loose watery stools,  and gastroesophageal reflux disease who is now admitted for further  evaluation and management of presyncopal symptoms.  She has had symptoms  of presyncope, which were short lasting without associated palpitations  over the past few weeks.  These episodes have all occurred while  standing.  She denies frank syncope.  I think that an arrhythmia is  therefore less likely.  The patient has had significant decline in her  oral intake, frequent loose watery  stools, and spends a significant  amount of time outdoors and often rides a tractor to cut grass.  I  suspect that this is culminated in significant volume depletion.  Her  sodium has been reduced and is presently 133.  I would therefore  recommend that we aggressively hydrate the patient.  She is very anxious  and fearful of low blood pressures and is also reluctant to take  atenolol.  I would therefore hold her atenolol at this time.  The  patient continues to smoke frequently and is at risk for coronary artery  disease.  I therefore think that an outpatient evaluation with a GXT  Myoview and transthoracic echocardiogram may be helpful.  I do not think  that further electrophysiology testing is necessary at this time and the  most likely cause for her episodes are volume depletion and possibly  vasovagal syndrome.  I would therefore simply hydrate the patient and  observe her on an outpatient basis.  An outpatient life watch monitor  may be helpful to evaluate her episodes to rule out arrhythmias.  We  will observe the patient overnight with aggressive hydration and then  she will likely be discharged and I will be happy to follow her in the  outpatient setting.      Hillis Range, MD  Electronically Signed     JA/MEDQ  D:  04/23/2009  T:  04/23/2009  Job:  [id number]   cc:   Rosalyn Gess. Norins, MD

## 2011-03-14 ENCOUNTER — Ambulatory Visit (HOSPITAL_COMMUNITY)
Admission: RE | Admit: 2011-03-14 | Discharge: 2011-03-14 | Disposition: A | Discharge status: Home / Self Care | Payer: Medicare Other | Source: Ambulatory Visit | Attending: Internal Medicine | Admitting: Internal Medicine

## 2011-03-14 DIAGNOSIS — Z1231 Encounter for screening mammogram for malignant neoplasm of breast: Secondary | ICD-10-CM | POA: Insufficient documentation

## 2011-03-16 NOTE — Procedures (Signed)
[facility]  Patient:    [name], [name] Visit Number: [id number] MRN: [id number]          Service Type: END Location: ENDO Attending Physician:  Sabino Gasser Dictated by:   Sabino Gasser, M.D. Proc. Date: 10/28/01 Admit Date:  10/28/2001                             Procedure Report  PROCEDURE PERFORMED:  Upper endoscopy.  ENDOSCOPIST:  Sabino Gasser, M.D.  INDICATIONS FOR PROCEDURE:  Reflux.  ANESTHESIA:  Demerol 50 mg, Versed 4.5 mg.  DESCRIPTION OF PROCEDURE:  With the patient mildly sedated in the left lateral decubitus position, the Olympus video endoscope was inserted in the mouth and passed under direct vision through the esophagus.  The distal esophagus ____________ and showed two distinct small areas of what appeared to be Barretts tissue.  This was photographed and biopsied.  We entered into the stomach.  The fundus, body, antrum, duodenal bulb and second portion of the duodenum all appeared normal.  From this point, the endoscope was slowly withdrawn taking circumferential views of the entire duodenal mucosa until the endoscope had been pulled back into the stomach and placed on retroflexion to view the stomach from below and a hiatal hernia was seen and photographed. The endoscope was then straightened and withdrawn taking circumferential views of the remaining gastric and esophageal mucosa which otherwise appeared normal.  Patients vital signs and pulse oximeter remained stable.  The patient tolerated the procedure well without apparent complications.  FINDINGS:  Barretts esophagus above a hiatal hernia with biopsy report.  The patient will call me for results and follow up with me as an outpatient.  PLAN: Dictated by:   Sabino Gasser, M.D. Attending Physician:  Sabino Gasser DD:  10/28/01 TD:  10/28/01 Job: [id number]

## 2011-03-16 NOTE — Procedures (Signed)
[facility]  Patient:    [name], [name] Visit Number: [id number] MRN: [id number]          Service Type: END Location: ENDO Attending Physician:  Sabino Gasser Dictated by:   Sabino Gasser, M.D. Proc. Date: 04/02/02 Admit Date:  04/02/2002                             Procedure Report  PROCEDURE:  Colonoscopy.  INDICATION:  Rectal bleeding.  ANESTHESIA:  Demerol 100, Versed 8 mg.  DESCRIPTION OF PROCEDURE:  With the patient mildly sedated in the left lateral decubitus position, a rectal exam was performed.  Subsequently, the Olympus videoscopic colonoscope was inserted into the rectum and passed under direct vision to the cecum, identified by the ileocecal valve and appendiceal orifice, both of which were photographed.  From this point, the colonoscope was slowly withdrawn, taking circumferential views of the entire colonic mucosa, stopping only in the rectum which appeared normal on direct and showed hemorrhoids on retroflexed view.  The colonoscope was straightened and withdrawn.  The patients vital signs and pulse oximeter remained stable.  The patient tolerated the procedure well without apparent complications.  FINDINGS:  Changes of hemorrhoid, otherwise an unremarkable colonoscopic examination.  PLAN:  Have patient follow up with me as needed. Dictated by:   Sabino Gasser, M.D. Attending Physician:  Sabino Gasser DD:  04/02/02 TD:  04/03/02 Job: [id number]

## 2011-03-16 NOTE — H&P (Signed)
NAME:  [name], POCH                         ACCOUNT NO.:  [id number]   MEDICAL RECORD NO.:  [id number]                   PATIENT TYPE:  INP   LOCATION:  2006                                 FACILITY:  [facility]   PHYSICIAN:  Hettie Holstein, D.O.                 DATE OF BIRTH:  1941-04-08   DATE OF ADMISSION:  07/09/2003  DATE OF DISCHARGE:                                HISTORY & PHYSICAL   The  patient's primary care physician is Dr. Higinio Plan.   CHIEF COMPLAINT:  Felt weak and legs were wobbly.  Near syncope.   HISTORY OF PRESENT ILLNESS:  This is a pleasant 70 year old female with past  medical history significant for longstanding tobacco abuse and  osteoarthritis as well as gastroesophageal reflux disease who presented to  the emergency department here at [facility] after developing an episode  around 8:15 today of what the patient described as sudden onset of  generalized weakness and experienced a sense of somewhat bad happening.  She had dinner with her family approximately 7 p.m. and went shopping  afterwards and experienced the above described chief complaint shortly  thereafter.  This is described as at approximately 8:15 p.m. while she was  about to light a cigarette. She was standing on the sidewalk and felt a  sudden onset sense of weakness and her legs became wobbly.  She leaned her  back up against a pillar and waited for what she described as a couple of  minutes for sensation to subside.  However, it did not.  She notified her  family.  She was escorted to the car and she subsequently seated herself  with her head between her knees in hopes of relieving this sensation.  She  denied any chest pain or shortness of breath and she described that this  maneuver did not help her weakness and generalized discomfort.  She was  subsequently brought to the [facility] Emergency Department which she  continued to have these sensations and her vital signs were normal with  the  exception of bradycardia.  She was normotensive.  In any event, she stated  that the sensation lasted approximately two to three hours.  In the  emergency department she complained of nausea and had an episode of  nonbloody emesis and shortly thereafter her symptoms improved.  She denies  any chest pain otherwise.   In the emergency room, she was assessed.  There were no acute EKG findings  and her initial cardiac markers were negative.  She did not exhibit  orthostasis on my examination.   PAST MEDICAL HISTORY:  1. Tobacco dependence.  2. Gastroesophageal reflux disease.  3. EGD performed on December 30, 2002 by Dr. Virginia Rochester which was reported as normal.  4. Colonoscopy performed on March 31, 2002 which revealed hemorrhoids.  5. She is status post hysterectomy.  6. She has osteoarthritis.  7. Has  been on hormone replacement therapy for many years.   MEDICATIONS:  1. Premarin daily.  2. Tranxene q.h.s.  3. Vioxx 12.5 mg p.o. daily.  4. Nexium 40 mg p.o. daily.   ALLERGIES:  No known drug allergies.   SOCIAL HISTORY:  The patient smokes one pack of cigarettes per day x40  years.  She denies alcohol.  She is an ophthalmology technician.   FAMILY HISTORY:  She had a mother who died at age of 30 with history of  hypertension and father who died at age 91 with history of acute aneurysm at  age of 72.   REVIEW OF SYSTEMS:  GENERAL:  The patient states that she has had about a 15  pound weight loss over the past two years.  She has had decreased appetite  since starting medications for osteoporosis.  HEENT:  The patient reports  some trouble focusing with her history of presenting illness. Otherwise, she  denies any new losses in her vision or hearing.  CARDIOVASCULAR:  The  patient denies any heart problems in the past.  Never had an echocardiogram.  Never been on stress treadmill.  PULMONARY:  The patient has longstanding  smoking history.  However, she has no productive cough.  No  pleuritic type  chest pain.  GI:  The patient denies blood in her stools.  She has been  evaluated by Dr. Loreta Ave in the past.  ENDOCRINE:  The patient denies history  of diabetes.   PHYSICAL EXAMINATION:  VITAL SIGNS:  Blood pressure 139/76, heart rate 79.  The patient exhibited no orthostasis on my examination with upright and  supine blood pressure measurement.  The patient's heart rate was 70-80.  Respirations nonlabored.  The patient was afebrile.  GENERAL:  This is a pleasant female in no apparent distress.  No jaundice.  No pallor.  NECK:  Supple. Nontender.  Thyroid is nonpalpable.  CARDIOVASCULAR:  Reveals S1, S2 without murmur.  PULMONARY:  Clear lungs bilaterally though diminished breath sounds.  ABDOMEN:  Soft, nontender.  EXTREMITIES:  No edema.  Only appearance of chronic scarring.   LABORATORY DATA:  EKG reveals sinus bradycardia, normal troponin less than  cutoff and CK of 67 and MB 1.5.  Sodium 141, potassium 3.4, BUN 9,  creatinine 0.9, glucose 133.  H&H is 15 and 43.   IMPRESSION/PLAN:  1. We are going to admit Ms. Brinkman to a telemetry floor, watch her closely     and rule her out for acute ischemic event.  2. Encourage smoking cessation.  3. Follow routine lab studies.                                                Hettie Holstein, D.O.    ESS/MEDQ  D:  07/10/2003  T:  07/11/2003  Job:  [id number]

## 2011-03-16 NOTE — Op Note (Signed)
   NAME:  [name], [name]                         ACCOUNT NO.:  [id number]   MEDICAL RECORD NO.:  [id number]                   PATIENT TYPE:  AMB   LOCATION:  ENDO                                 FACILITY:  [facility]   PHYSICIAN:  Georgiana Spinner, M.D.                 DATE OF BIRTH:  11/29/1940   DATE OF PROCEDURE:  12/31/2002  DATE OF DISCHARGE:                                 OPERATIVE REPORT   PROCEDURE PERFORMED:  Upper endoscopy.   ENDOSCOPIST:  Georgiana Spinner, M.D.   INDICATIONS FOR PROCEDURE:  Question of Barrett's esophagus.   ANESTHESIA:  Demerol 70 mg, Versed 7 mg.   DESCRIPTION OF PROCEDURE:  With the patient mildly sedated in the left  lateral decubitus position, the Olympus video endoscope was inserted in the  mouth and passed under direct vision through the esophagus which appeared  normal.  There was no clear evidence of Barrett's endoscopically at this  time but photographs of the distal esophagus and biopsies were taken.  We  entered into the stomach.  The fundus, body, antrum, duodenal bulb all  appeared normal.  From this point, the endoscope was slowly withdrawn taking  circumferential views of the duodenal mucosa until the endoscope was pulled  back into the stomach and placed on retroflexion to view the stomach from  below.  The endoscope was then straightened and withdrawn taking  circumferential views of the remaining gastric and esophageal mucosa.  The  patient's vital signs and pulse oximeter remained stable.  The patient  tolerated the procedure well without apparent complications.   FINDINGS:  Unremarkable examination at this time.   PLAN:  Await biopsy report.  The patient will call me for results and follow  up with me as an outpatient.                                               Georgiana Spinner, M.D.    GMO/MEDQ  D:  12/31/2002  T:  12/31/2002  Job:  [id number]

## 2011-03-16 NOTE — Op Note (Signed)
NAMEFATE, GALANTI               ACCOUNT NO.:  [id number]   MEDICAL RECORD NO.:  [id number]          PATIENT TYPE:  AMB   LOCATION:  ENDO                         [facility]ACILITY:  [facility]   PHYSICIAN:  Georgiana Spinner, M.D.    DATE OF BIRTH:  06/14/1941   DATE OF PROCEDURE:  DATE OF DISCHARGE:                                 OPERATIVE REPORT   PROCEDURE:  Upper endoscopy.   INDICATIONS:  GERD.   ANESTHESIA:  Demerol 60, Versed 6 mg.   PROCEDURE IN DETAIL:  With the patient mildly sedated in the left lateral  decubitus position, the Olympus videoscopic endoscope was inserted and  passed under direct vision through the esophagus which appeared normal. At  this point I could really not see any distinct evidence of Barrett's  esophagus which apparently had been seen previously. We entered into the  stomach, fundus body, antrum, duodenal bulb, second portion duodenum  appeared normal. From this point the endoscope was slowly withdrawn, taking  circumferential views of duodenal mucosa until the endoscope had been pulled  back in the stomach placed in retroflexion to view stomach from below. The  endoscope was then straightened and withdrawn taking circumferential views  of remaining gastric and esophageal mucosa stopping in the distal esophagus  to sample the squamocolumnar junction to see if there was any evidence by  biopsy of Barrett's although none was seen endoscopically. The endoscope was  withdrawn. The patient's vital signs, pulse oximeter remained stable. The  patient tolerated procedure well without apparent complications.   FINDINGS:  Rather unremarkable examination with random biopsies taken around  the squamocolumnar junction.  Wait biopsy report. The patient will call me  for results and follow up with me as an outpatient.           ______________________________  Georgiana Spinner, M.D.     GMO/MEDQ  D:  08/06/2005  T:  08/06/2005  Job:  [id number]   cc:   Rosalyn Gess. Norins, M.D.  [facility]  [address]  [place]  [place] [address]

## 2011-03-16 NOTE — Discharge Summary (Signed)
NAME:  [name], [name]                         ACCOUNT NO.:  [id number]   MEDICAL RECORD NO.:  [id number]                   PATIENT TYPE:  INP   LOCATION:  2006                                 FACILITY:  [facility]   PHYSICIAN:  Hettie Holstein, D.O.                 DATE OF BIRTH:  Jan 11, 1941   DATE OF ADMISSION:  07/09/2003  DATE OF DISCHARGE:  07/12/2003                                 DISCHARGE SUMMARY   PRIMARY CARE PHYSICIAN:  Janae Bridgeman. Lendell Caprice, M.D.   CHIEF COMPLAINT:  Near syncope, weak legs and wobbly.   HISTORY OF PRESENT ILLNESS:  A 70 year old white female with past medical  history significant for tobacco abuse, osteoarthritis, GERD, who presented  to [facility] Emergency Room with an episode at 8:15 in the p.m. with sudden  onset of generalized weakness experiencing a sense of foreboding.  She had  dinner with the family at 7 p.m., went shopping and afterwards described the  episode above.  She denied any shortness of breath, chest pain,  palpitations, at that time.  No neurological symptomatology was described  either.  In the emergency room at [facility], transient  bradycardia in the high 40 beats per minute is noted but not recorded with  EKG.  The patient complained of transient nausea at that time without bloody  emesis or vomitus.   MEDICATIONS ON ADMISSION:  1. Premarin 0.625 p.o. daily.  2. Tranxene q.h.s. dose unknown.  3. Vioxx 12.5 mg p.o. daily.  4. Nexium 40 mg p.o. daily.   ALLERGIES:  No known drug allergies.   PAST MEDICAL HISTORY:  1. Tobacco dependence.  2. GERD.  3. EGD performed December 30, 2002, Dr. Virginia Rochester, found to be negative.  4. Colonoscopy March 31, 2002, positive hemorrhoids.  5. Osteoarthritis.  6. Hormone replacement therapy for many years.   PAST SURGICAL HISTORY:  Status post hysterectomy.   SOCIAL HISTORY:  Tobacco smoker, 40-pack years + +.  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   FAMILY HISTORY:  Mother deceased at 57 secondary to history of hypertension.  Father deceased at age 80 secondary to acute aneurysm.   REVIEW OF SYMPTOMS:  The patient states that she has lost approximately 15  pounds over the last two years.  She has had decreased appetite secondary to  ________ medications.  All other systems negative.   PHYSICAL EXAMINATION ON ADMISSION:  GENERAL APPEARANCE:  No apparent  distress.  No jaundice, no pallor.  VITAL SIGNS:  Blood pressure 139/76, heart rate 79, respiratory rate 18,  pulse oximeter not recorded.  HEENT:  EOMI, PERRLA.  Nonicteric sclerae.  NECK:  Supple.  No JVD.  No palpable thyroid or adenopathy.  LUNGS:  Clear to auscultation and percussion.  CARDIOVASCULAR:  Regular rate and rhythm, S1 and S2, no murmurs, rubs, or  gallops.  ABDOMEN:  Soft,  nontender, nondistended, positive bowel sounds.  EXTREMITIES:  No edema.  Residual chronic scarring.  Xeroderma.   LABORATORY DATA ON ADMISSION:  CK 67, MB 1.5, troponin negative.  Sodium  141, potassium 3.4, BUN 9, creatinine 0.9, glucose 133.  Hemoglobin 15,  hematocrit 43.   DIAGNOSTIC TESTING:  EKG reveals bradycardia (EKG not found on chart post  admission).   HOSPITAL COURSE:  The patient is admitted to telemetry floor.  She undergoes  cardiac enzymes protocol and has negative enzymes with no EKG changes.  Follow-up EKGs show no bradycardia.  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  Echocardiogram  is also performed showing no wall motion abnormalities, EF  of 55 to 65%, no AS, and no MR or TR present at this time.  Essentially  normal cardiac enzymes.   Bradycardia was not documented in the 72 hours post admission.  After  discussion with Dr. Lendell Caprice, the option of 30-day Holter monitor is  foregone since it would probably have a low yield at  this time.   The patient is ambulatory without dizziness or repeated symptomatology.   The patient develops vaginal candidiasis given Diflucan 200 loading dose and  100 mg p.o. daily. for a total of five days.   DISCHARGE DIAGNOSES:  1. Vasovagal episode.  2. Vaginal candidiasis.  3. Transient bradycardia.   DISCHARGE MEDICATIONS:  1. Enteric coated aspirin 81 p.o. daily.  2. Nexium 40 mg p.o. daily.  3. Vioxx 12.5 p.o. daily on hold.  4. Premarin 0.625 mg p.o. daily on hold.  5. Tranxene 3.75 mg p.o. q.h.s.  6. Diflucan 100 mg p.o. daily x3 days.   DISCHARGE INSTRUCTIONS:  1. Follow with Dr. Lendell Caprice, appointment for 3 p.m. this afternoon.  2. Return to the emergency room should you have similar repeat     symptomatology.   PROGNOSIS:  Good.   CONDITION ON DISCHARGE:  Good.    SPECIAL INSTRUCTIONS:  The patient is given a prescription for Diflucan.  Dr. Lendell Caprice is aware of the patient's appointment for follow-up.      Norwood Levo, MD                     Hettie Holstein, D.O.    APM/MEDQ  D:  07/12/2003  T:  07/12/2003  Job:  [id number]   cc:   Janae Bridgeman. Eloise Harman., M.D.  [address]  [place]  [place] [address]  Fax: [phone]

## 2011-04-04 ENCOUNTER — Encounter: Payer: Self-pay | Admitting: Internal Medicine

## 2011-05-01 ENCOUNTER — Encounter: Payer: Self-pay | Admitting: Internal Medicine

## 2011-05-03 ENCOUNTER — Encounter: Payer: Self-pay | Admitting: Internal Medicine

## 2011-05-03 DIAGNOSIS — R197 Diarrhea, unspecified: Secondary | ICD-10-CM

## 2011-05-07 ENCOUNTER — Encounter: Payer: Self-pay | Admitting: Internal Medicine

## 2011-05-08 ENCOUNTER — Ambulatory Visit (INDEPENDENT_AMBULATORY_CARE_PROVIDER_SITE_OTHER): Payer: Medicare Other | Admitting: Internal Medicine

## 2011-05-08 ENCOUNTER — Telehealth: Payer: Self-pay | Admitting: *Deleted

## 2011-05-08 ENCOUNTER — Other Ambulatory Visit (INDEPENDENT_AMBULATORY_CARE_PROVIDER_SITE_OTHER): Payer: Medicare Other

## 2011-05-08 ENCOUNTER — Encounter: Payer: Self-pay | Admitting: Internal Medicine

## 2011-05-08 VITALS — BP 120/72 | HR 94 | Temp 97.8°F | Ht 68.5 in | Wt 120.0 lb

## 2011-05-08 DIAGNOSIS — Z79899 Other long term (current) drug therapy: Secondary | ICD-10-CM

## 2011-05-08 DIAGNOSIS — F411 Generalized anxiety disorder: Secondary | ICD-10-CM

## 2011-05-08 DIAGNOSIS — K589 Irritable bowel syndrome without diarrhea: Secondary | ICD-10-CM

## 2011-05-08 DIAGNOSIS — Z0000 Encounter for general adult medical examination without abnormal findings: Secondary | ICD-10-CM

## 2011-05-08 DIAGNOSIS — Z872 Personal history of diseases of the skin and subcutaneous tissue: Secondary | ICD-10-CM

## 2011-05-08 DIAGNOSIS — M1990 Unspecified osteoarthritis, unspecified site: Secondary | ICD-10-CM

## 2011-05-08 DIAGNOSIS — Z23 Encounter for immunization: Secondary | ICD-10-CM

## 2011-05-08 DIAGNOSIS — Z136 Encounter for screening for cardiovascular disorders: Secondary | ICD-10-CM

## 2011-05-08 DIAGNOSIS — L259 Unspecified contact dermatitis, unspecified cause: Secondary | ICD-10-CM

## 2011-05-08 LAB — COMPREHENSIVE METABOLIC PANEL
Albumin: 4.3 g/dL (ref 3.5–5.2)
BUN: 10 mg/dL (ref 6–23)
CO2: 31 mEq/L (ref 19–32)
GFR: 75.56 mL/min (ref >60.00)
Glucose, Bld: 107 mg/dL — ABNORMAL HIGH (ref 70–99)
Potassium: 4.6 mEq/L (ref 3.5–5.1)
Sodium: 140 mEq/L (ref 135–145)
Total Bilirubin: 0.5 mg/dL (ref 0.3–1.2)
Total Protein: 7.9 g/dL (ref 6.0–8.3)

## 2011-05-08 LAB — LIPID PANEL
Cholesterol: 146 mg/dL (ref 0–200)
HDL: 49.7 mg/dL (ref >39.00)

## 2011-05-08 LAB — CBC WITH DIFFERENTIAL/PLATELET
Basophils Absolute: 0.1 10*3/uL (ref 0.0–0.1)
Eosinophils Absolute: 0.1 10*3/uL (ref 0.0–0.7)
Hemoglobin: 14.5 g/dL (ref 12.0–15.0)
Lymphocytes Relative: 23.9 % (ref 12.0–46.0)
MCHC: 34.1 g/dL (ref 30.0–36.0)
Neutro Abs: 5.6 10*3/uL (ref 1.4–7.7)
Platelets: 384 10*3/uL (ref 150.0–400.0)
RDW: 13.9 % (ref 11.5–14.6)

## 2011-05-08 LAB — TSH: TSH: 0.95 u[IU]/mL (ref 0.35–5.50)

## 2011-05-08 LAB — HEPATIC FUNCTION PANEL: Albumin: 4.3 g/dL (ref 3.5–5.2)

## 2011-05-08 LAB — VITAMIN B12: Vitamin B-12: 270 pg/mL (ref 211–911)

## 2011-05-08 MED ORDER — TETANUS-DIPHTH-ACELL PERTUSSIS 5-2.5-18.5 LF-MCG/0.5 IM SUSP: 0.5000 mL | Freq: Once | INTRAMUSCULAR | Status: DC

## 2011-05-08 MED ORDER — ESTROGENS CONJUGATED 0.3 MG PO TABS: 0.3000 mg | ORAL_TABLET | Freq: Every day | ORAL | Status: DC

## 2011-05-08 MED ORDER — SALEX 6 % LOTION EX KIT: 10.0000 | PACK | CUTANEOUS | Status: DC | PRN

## 2011-05-08 MED ORDER — CLORAZEPATE DIPOTASSIUM 7.5 MG PO TABS: ORAL_TABLET | ORAL | Status: DC

## 2011-05-08 MED ORDER — NAFTIFINE HCL 1 % EX CREA: TOPICAL_CREAM | Freq: Every day | CUTANEOUS | Status: DC | PRN

## 2011-05-08 MED ORDER — PNEUMOCOCCAL VAC POLYVALENT 25 MCG/0.5ML IJ INJ: 0.5000 mL | INJECTION | Freq: Once | INTRAMUSCULAR | Status: DC

## 2011-05-08 MED ORDER — CLOTRIMAZOLE-BETAMETHASONE 1-0.05 % EX CREA: TOPICAL_CREAM | Freq: Two times a day (BID) | CUTANEOUS | Status: DC

## 2011-05-08 NOTE — Telephone Encounter (Signed)
Daughter left vm req a call back regarding pt's OV w/Dr Norins. (She is on HIPPA form)

## 2011-05-08 NOTE — Progress Notes (Signed)
Subjective:    Patient ID: [name], female    DOB: 04/18/1941, 70 y.o.   MRN: [id number]  HPI  The patient is here for annual Medicare wellness examination and management of other chronic and acute problems. She has seen Dr. Donell Beers for psychiatry. She has also seen Ollen Gross, PhD - both visits were in December '11. She was diagnosed with anxiety attacks and depression that was affecting her health. She still has remaining issues. 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    The risk factors are reflected in the social history.  The roster of all physicians providing medical care to patient - is listed in the Snapshot section of the chart.  Activities of daily living:  The patient is 100% inedpendent in all ADLs: dressing, toileting, feeding as well as independent mobility  Home safety : The patient has smoke detectors in the home. They wear seatbelts. firearms at home. There is no violence in the home.   There is no risks for hepatitis, STDs or HIV. There is no   history of blood transfusion. They have no travel history to infectious disease endemic areas of the world.  The patient has seen their dentist in the last six month. They have seen their eye doctor in the last year. They deny  any hearing difficulty and have not had audiologic testing in the last year.  They do not  have excessive sun exposure. Discussed the need for sun protection: hats, long sleeves and use of sunscreen if there is significant sun exposure.   Diet: the importance of a healthy diet is discussed. They do have a unhealthy-high fat/fast food diet.  The patient has no regular exercise program: yard work, house work.  The benefits of regular aerobic exercise were discussed.  Depression screen: there are no signs or vegative symptoms of depression- irritability, change in appetite, anhedonia.  Cognitive assessment: the patient manages all their financial and  personal affairs and is actively engaged. They could relate day,date,year and events; recalled 3/3 objects at 3 minutes; performed clock-face test normally.  The following portions of the patient's history were reviewed and updated as appropriate: allergies, current medications, past family history, past medical history,  past surgical history, past social history  and problem list.  Vision, hearing, body mass index were assessed and reviewed.   During the course of the visit the patient was educated and counseled about appropriate screening and preventive services including : fall prevention , diabetes screening, nutrition counseling, colorectal cancer screening, and recommended immunizations.    Review of Systems Review of Systems  Constitutional:  Negative for fever, chills, activity change and unexpected weight change.  HEENT:  Negative for hearing loss, ear pain, congestion, neck stiffness. Positive for postnasal drip. Negative for sore throat or swallowing problems. Negative for dental complaints.   Eyes: Negative for vision loss or change in visual acuity.  Respiratory: Negative for chest tightness and wheezing.   Cardiovascular: Negative for chest pain and palpitation. No decreased exercise tolerance Gastrointestinal: No change in bowel habit. No bloating or gas. No reflux or indigestion Genitourinary: Negative for urgency, frequency, flank pain and difficulty urinating.  Musculoskeletal: Negative for myalgias, back pain, arthralgias and gait problem.  Neurological: Negative for dizziness, tremors, weakness and headaches.  Hematological: Negative for adenopathy.  Psychiatric/Behavioral: Negative for behavioral problems and dysphoric mood.       Objective:   Physical Exam Vitals reviewed. Gen'l - very thin  white woman in no acute distress HEENT - Hagerman/at, EAC/TMs normal, oropharynx without lesions. Neck - supple, no thyromegaly Chest - no deformity Breast - nipples w/o  discharge, no fixed mass or lesion, no axillary adenopathy Lungs - CTAP Cor - 2+ radial and DP pulses, RRR, bounding sounds but quiet precordium Abdomen - soft,m BS+ x 4, no HSM Ext- w/o deformity Derm - marked rash on feet with scale and small ulcers, macular rash on legs. Neuro - non focal.  Lab Results  Component Value Date   WBC 8.1 05/08/2011   HGB 14.5 05/08/2011   HCT 42.6 05/08/2011   PLT 384.0 05/08/2011   CHOL 146 05/08/2011   TRIG 55.0 05/08/2011   HDL 49.70 05/08/2011   ALT 11 05/08/2011   ALT 11 05/08/2011   AST 15 05/08/2011   AST 15 05/08/2011   NA 140 05/08/2011   K 4.6 05/08/2011   CL 104 05/08/2011   CREATININE 0.8 05/08/2011   BUN 10 05/08/2011   CO2 31 05/08/2011   TSH 0.95 05/08/2011   INR 1.0 04/22/2009   Lab Results  Component Value Date   LDLCALC 85 05/08/2011           Assessment & Plan:

## 2011-05-09 ENCOUNTER — Other Ambulatory Visit: Payer: Self-pay | Admitting: *Deleted

## 2011-05-09 DIAGNOSIS — Z0000 Encounter for general adult medical examination without abnormal findings: Secondary | ICD-10-CM | POA: Insufficient documentation

## 2011-05-09 MED ORDER — ZOSTER VACCINE LIVE 19400 UNT/0.65ML ~~LOC~~ SOLR: 0.6500 mL | Freq: Once | SUBCUTANEOUS | Status: DC

## 2011-05-09 NOTE — Telephone Encounter (Signed)
Spoke w/daughter & patient. Explained medicare wellness exam vs CPX and why questions were asked at OV (ex - firearms, MMSE etc). Also explained to pt that u/a was not part of routine screening for dr norins anymore. They both have no further questions.

## 2011-05-09 NOTE — Assessment & Plan Note (Signed)
Eczema is flaring with increased skin changes at feet and ankles and palms of her hands. She has continued topical treatment.  Plan - she is advised to schedule a follow-up appointment with her dermatologist.

## 2011-05-09 NOTE — Assessment & Plan Note (Signed)
She has seen Dr. Donell Beers but is now released with recommendation to remain on her current medical regimen and that PCP can prescribe for her. She also had very limited counseling. She feels that she is under stress but doing OK at this time.   Plan - will renew meds as needed.

## 2011-05-09 NOTE — Assessment & Plan Note (Signed)
Interval history is notable for weight loss. Her chart is reviewed and she has a history of fluctuating weight between 136 and 120. Otherwise she is doing OK. Physical exam is normal. Lab results are within normal range. She is current with mammography and had a normal breast exam. She is current with colorectal cancer screening with last study Sept '10. Immunizations: Tdap and Pneumonia vaccine today. She will return for shingles vaccine.  In summary - a nice woman who is medically stable. She is advised to adopt an aerobic exercise program. She needs to be sure to take in enough calories. She is advised to have a low threshold for returning to her counselor if her stress levels rise. She will return as needed or in one year.

## 2011-05-09 NOTE — Assessment & Plan Note (Signed)
Stable at this time. She will follow up with Dr. Leone Payor as needed

## 2011-06-19 ENCOUNTER — Other Ambulatory Visit: Payer: Self-pay | Admitting: Dermatology

## 2011-08-06 ENCOUNTER — Telehealth: Payer: Self-pay | Admitting: *Deleted

## 2011-08-06 NOTE — Telephone Encounter (Signed)
Patient informed. 

## 2011-08-06 NOTE — Telephone Encounter (Signed)
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 However, Dr. Nicholas Lose is a very good dermatologist and I am comfortable with you taking what she has prescribed.

## 2011-08-06 NOTE — Telephone Encounter (Signed)
Spoke with patient - she was seen by [facility], Dr Glori Luis. She was given clobetasol and calcitriol ointment. 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 The ointment is difficult b/c she has to constantly apply it on her feet. Dr Nicholas Lose wants patient to try soriatane 25 mg once daily.   Patient is very worried about this medication and wants Dr Debby Bud opinion as to how he feels about this med? Should she try it? Is it dangerous?

## 2011-11-22 ENCOUNTER — Other Ambulatory Visit: Payer: Self-pay

## 2011-11-22 MED ORDER — CLORAZEPATE DIPOTASSIUM 7.5 MG PO TABS: ORAL_TABLET | ORAL | Status: DC

## 2012-04-29 ENCOUNTER — Other Ambulatory Visit: Payer: Self-pay | Admitting: Internal Medicine

## 2012-04-29 DIAGNOSIS — Z1231 Encounter for screening mammogram for malignant neoplasm of breast: Secondary | ICD-10-CM

## 2012-05-14 ENCOUNTER — Other Ambulatory Visit: Payer: Self-pay | Admitting: *Deleted

## 2012-05-14 ENCOUNTER — Other Ambulatory Visit: Payer: Self-pay | Admitting: Internal Medicine

## 2012-05-14 NOTE — Telephone Encounter (Signed)
Med sent to [facility],  tranxene

## 2012-05-20 ENCOUNTER — Ambulatory Visit (HOSPITAL_COMMUNITY)
Admission: RE | Admit: 2012-05-20 | Discharge: 2012-05-20 | Disposition: A | Discharge status: Home / Self Care | Payer: Medicare Other | Source: Ambulatory Visit | Attending: Internal Medicine | Admitting: Internal Medicine

## 2012-05-20 DIAGNOSIS — Z1231 Encounter for screening mammogram for malignant neoplasm of breast: Secondary | ICD-10-CM | POA: Insufficient documentation

## 2012-05-20 LAB — HM MAMMOGRAPHY

## 2012-06-24 ENCOUNTER — Ambulatory Visit (INDEPENDENT_AMBULATORY_CARE_PROVIDER_SITE_OTHER): Payer: Medicare Other | Admitting: Internal Medicine

## 2012-06-24 ENCOUNTER — Other Ambulatory Visit (INDEPENDENT_AMBULATORY_CARE_PROVIDER_SITE_OTHER): Payer: Medicare Other

## 2012-06-24 ENCOUNTER — Encounter: Payer: Self-pay | Admitting: Internal Medicine

## 2012-06-24 VITALS — BP 116/70 | HR 78 | Temp 98.0°F | Resp 16 | Wt 120.0 lb

## 2012-06-24 DIAGNOSIS — L408 Other psoriasis: Secondary | ICD-10-CM

## 2012-06-24 DIAGNOSIS — M1990 Unspecified osteoarthritis, unspecified site: Secondary | ICD-10-CM

## 2012-06-24 DIAGNOSIS — Z23 Encounter for immunization: Secondary | ICD-10-CM

## 2012-06-24 DIAGNOSIS — Z720 Tobacco use: Secondary | ICD-10-CM

## 2012-06-24 DIAGNOSIS — F17210 Nicotine dependence, cigarettes, uncomplicated: Secondary | ICD-10-CM | POA: Insufficient documentation

## 2012-06-24 DIAGNOSIS — E1169 Type 2 diabetes mellitus with other specified complication: Secondary | ICD-10-CM

## 2012-06-24 DIAGNOSIS — M810 Age-related osteoporosis without current pathological fracture: Secondary | ICD-10-CM

## 2012-06-24 DIAGNOSIS — F17200 Nicotine dependence, unspecified, uncomplicated: Secondary | ICD-10-CM

## 2012-06-24 DIAGNOSIS — L409 Psoriasis, unspecified: Secondary | ICD-10-CM

## 2012-06-24 DIAGNOSIS — F411 Generalized anxiety disorder: Secondary | ICD-10-CM

## 2012-06-24 DIAGNOSIS — L988 Other specified disorders of the skin and subcutaneous tissue: Secondary | ICD-10-CM

## 2012-06-24 DIAGNOSIS — Z0000 Encounter for general adult medical examination without abnormal findings: Secondary | ICD-10-CM

## 2012-06-24 LAB — COMPREHENSIVE METABOLIC PANEL
ALT: 11 U/L (ref 0–35)
CO2: 28 mEq/L (ref 19–32)
Calcium: 9.1 mg/dL (ref 8.4–10.5)
Chloride: 101 mEq/L (ref 96–112)
Creatinine, Ser: 0.7 mg/dL (ref 0.4–1.2)
GFR: 86.44 mL/min (ref >60.00)
Total Protein: 7.7 g/dL (ref 6.0–8.3)

## 2012-06-24 MED ORDER — CLOTRIMAZOLE-BETAMETHASONE 1-0.05 % EX CREA: TOPICAL_CREAM | Freq: Two times a day (BID) | CUTANEOUS | Status: DC

## 2012-06-24 NOTE — Progress Notes (Signed)
Subjective:    Patient ID: [name], female    DOB: 06-19-1941, 71 y.o.   MRN: [id number]  HPI The patient is here for annual Medicare wellness examination and management of other chronic and acute problems.  Cc: concerned about weight loss. She has stomach cramping at night.    The risk factors are reflected in the social history.  The roster of all physicians providing medical care to patient - is listed in the Snapshot section of the chart.  Activities of daily living:  The patient is 100% inedpendent in all ADLs: dressing, toileting, feeding as well as independent mobility  Home safety : The patient has smoke detectors in the home. Fall - home inventory -recommended grab.They wear seatbelts. No firearms at home.  There is no violence in the home.   There is no risks for hepatitis, STDs or HIV. There is no history of blood transfusion. They have no travel history to infectious disease endemic areas of the world.  The patient has seen their dentist in the last six month. They have seen their eye doctor in the last year. They deny any hearing difficulty and have not had audiologic testing in the last year.    They do not  have excessive sun exposure. Discussed the need for sun protection: hats, long sleeves and use of sunscreen if there is significant sun exposure.   Diet: the importance of a healthy diet is discussed. They do have a healthy diet.  The patient has a regular exercise program: walk , 10 min duration, 3 days per week.  The benefits of regular aerobic exercise were discussed.  Depression screen: there are no signs or vegative symptoms of depression- irritability, change in appetite, anhedonia, sadness/tearfullness.  Cognitive assessment: the patient manages all their financial and personal affairs and is actively engaged. They could relate day,date,year and events; recalled 3/3 objects at 3 minutes; performed clock-face test normally.  The following portions of the  patient's history were reviewed and updated as appropriate: allergies, current medications, past family history, past medical history,  past surgical history, past social history  and problem list.  Vision, hearing, body mass index were assessed and reviewed.   During the course of the visit the patient was educated and counseled about appropriate screening and preventive services including : fall prevention , diabetes screening, nutrition counseling, colorectal cancer screening, and recommended immunizations.  Past Medical History  Diagnosis Date  . Heart murmur     childhood  . Ruptured eardrum     childhood  . Hemorrhoids, internal   . Hx: UTI (urinary tract infection)   . Necrobiosis lipoidica   . GERD (gastroesophageal reflux disease)   . Osteoarthritis   . Osteoporosis   . Anxiety   . Anxiety state, unspecified 03/02/2008  . ECZEMA, HX OF 07/25/2007    psoriasis  . GERD 07/25/2007  . Irritable bowel syndrome 08/18/2009  . OSTEOARTHRITIS 07/25/2007  . OSTEOPOROSIS 07/25/2007  . Palpitations 05/27/2009  . SYNCOPE 04/21/2009   Past Surgical History  Procedure Date  . Vesicovaginal fistula closure w/ tah     with BSO-'82  . Bunionectomy     with hammertoe correction - '93  . Abdominal hysterectomy    Family History  Problem Relation Age of Onset  . Arthritis Mother   . Heart disease Mother   . Hypertension Mother   . Heart attack Paternal Grandfather   . Coronary artery disease Other     paternal kinship  . Diabetes  Other     Paternal kinship  . Cancer Neg Hx     colon  . Diabetes Brother   . Heart disease Brother     PAD   History   Social History  . Marital Status: Divorced    Spouse Name: N/A    Number of Children: 2  . Years of Education: N/A   Occupational History  . opthal tech    Social History Main Topics  . Smoking status: Current Everyday Smoker -- 1.0 packs/day for 50 years    Types: Cigarettes  . Smokeless tobacco: Never Used  . Alcohol Use:  Yes     occasional glass of wine  . Drug Use: No  . Sexually Active: No   Other Topics Concern  . Not on file   Social History Narrative   [employer], [employer] 2 year associates degree. Married  '61 -Divorced after 17 yrs marriage. 1 dtr - '66; 1 son '68,2 grandchildren. Work - Advice worker - retired. ACP - CPR yes, yes for short term mechanical ventilation. No prolonged heroic measures. Provided Packet.       Review of Systems Constitutional:  Negative for fever, chills, activity change and unexpected weight change.  HEENT:  Negative for hearing loss, ear pain, congestion, neck stiffness and postnasal drip. Negative for sore throat or swallowing problems. Negative for dental complaints.   Eyes: Negative for vision loss or change in visual acuity.  Respiratory: Negative for chest tightness and wheezing. Negative for DOE.   Cardiovascular: Negative for chest pain or palpitations. No decreased exercise tolerance Gastrointestinal: No change in bowel habit. No bloating or gas. No reflux or indigestion Genitourinary: Negative for urgency, frequency, flank pain and difficulty urinating.  Musculoskeletal: Negative for myalgias, back pain, arthralgias and gait problem.  Neurological: Negative for dizziness, tremors, weakness and headaches.  Hematological: Negative for adenopathy.  Psychiatric/Behavioral: Negative for behavioral problems and dysphoric mood.       Objective:   Physical Exam Filed Vitals:   06/24/12 0911  BP: 116/70  Pulse: 78  Temp: 98 F (36.7 C)  Resp: 16   Wt Readings from Last 3 Encounters:  06/24/12 120 lb (54.432 kg)  05/08/11 120 lb (54.432 kg)  05/17/10 121 lb (54.885 kg)   Gen'l: well nourished, well developed white Woman in no distress HEENT - [place]/AT, EACs/TMs normal, oropharynx with native dentition in good condition, no buccal or palatal lesions, posterior pharynx clear, mucous membranes moist. C&S clear, PERRLA, fundi - normal Neck -  supple, no thyromegaly Nodes- negative submental, cervical, supraclavicular regions Chest - no deformity, no CVAT Lungs - clear without rales, wheezes. No increased work of breathing Breast - - Skin normal, nipples w/o discharge, no fixed mass or lesion, no axillary adenopathy. Cardiovascular - regular rate and rhythm, quiet precordium, no murmurs, rubs or gallops, 2+ radial, DP and PT pulses Abdomen - BS+ x 4, no HSM, no guarding or rebound or tenderness Pelvic - deferred to gyn Rectal - deferred to gyn Extremities - no clubbing, cyanosis, edema or deformity.  Neuro - A&O x 3, CN II-XII normal, motor strength normal and equal, DTRs 2+ and symmetrical biceps, radial, and patellar tendons. Cerebellar - no tremor, no rigidity, fluid movement and normal gait. Derm - Head, neck, back, abdomen and extremities without suspicious lesions  Lab Results  Component Value Date   WBC 8.1 05/08/2011   HGB 14.5 05/08/2011   HCT 42.6 05/08/2011   PLT 384.0 05/08/2011   GLUCOSE 107*  06/24/2012   CHOL 146 05/08/2011   TRIG 55.0 05/08/2011   HDL 49.70 05/08/2011   LDLCALC 85 05/08/2011   ALT 11 06/24/2012   AST 11 06/24/2012   NA 136 06/24/2012   K 4.8 06/24/2012   CL 101 06/24/2012   CREATININE 0.7 06/24/2012   BUN 12 06/24/2012   CO2 28 06/24/2012   TSH 0.95 05/08/2011   INR 1.0 04/22/2009          Assessment & Plan:

## 2012-06-25 NOTE — Assessment & Plan Note (Signed)
Last DEXA in '11 - thin frame and smoker. Due for follow-up DEXA

## 2012-06-25 NOTE — Assessment & Plan Note (Signed)
50 pack year hx. Reports increased SOB over past 6 months and a productive morning cough. Per pulmonary guidelines she will be scheduled for low radiation dose CT chest w/o contrast. Adamantly encouraged to stop smoking - she is pre-contemplative.

## 2012-06-25 NOTE — Assessment & Plan Note (Signed)
Follows with dermatology 

## 2012-06-25 NOTE — Assessment & Plan Note (Signed)
Stable. Follows with dermatology.  

## 2012-06-25 NOTE — Assessment & Plan Note (Signed)
Reports that she is doing OK and continues to take Tranxene. She is not currently seeing a therapist or psychiatrist.

## 2012-06-25 NOTE — Assessment & Plan Note (Signed)
Interval medical history is negative for any major illness, injury or surgery. Physical exam is normal, but she is thin. She is current with colorectal and breast cancer screening. Lab results are in normal limits, including normal glucose level. Immunizations are up to date.  In summary - a nice woman who is medically stable. She is encouraged to stop smoking. She is provide packet for ACP. She will return in 1 year or as needed.

## 2012-07-01 ENCOUNTER — Ambulatory Visit (INDEPENDENT_AMBULATORY_CARE_PROVIDER_SITE_OTHER)
Admission: RE | Admit: 2012-07-01 | Discharge: 2012-07-01 | Disposition: A | Discharge status: Home / Self Care | Payer: Medicare Other | Source: Ambulatory Visit | Attending: Internal Medicine | Admitting: Internal Medicine

## 2012-07-01 DIAGNOSIS — F17200 Nicotine dependence, unspecified, uncomplicated: Secondary | ICD-10-CM

## 2012-07-01 DIAGNOSIS — Z720 Tobacco use: Secondary | ICD-10-CM

## 2012-07-01 DIAGNOSIS — R0602 Shortness of breath: Secondary | ICD-10-CM

## 2012-07-03 ENCOUNTER — Telehealth: Payer: Self-pay

## 2012-07-03 NOTE — Telephone Encounter (Signed)
Pt called requesting results of recent CT scan.  

## 2012-07-03 NOTE — Telephone Encounter (Signed)
Patient notified of CT scan results.

## 2012-07-03 NOTE — Telephone Encounter (Signed)
Evidence of COPD, multiple non-specific pulmonary nodules with recommendation for follow-up study in 6 months. Letter with more detail will be forth-coming.

## 2012-07-06 ENCOUNTER — Encounter: Payer: Self-pay | Admitting: Internal Medicine

## 2012-07-06 DIAGNOSIS — R918 Other nonspecific abnormal finding of lung field: Secondary | ICD-10-CM

## 2012-07-07 ENCOUNTER — Ambulatory Visit (INDEPENDENT_AMBULATORY_CARE_PROVIDER_SITE_OTHER)
Admission: RE | Admit: 2012-07-07 | Discharge: 2012-07-07 | Disposition: A | Discharge status: Home / Self Care | Payer: Medicare Other | Source: Ambulatory Visit

## 2012-07-07 DIAGNOSIS — M810 Age-related osteoporosis without current pathological fracture: Secondary | ICD-10-CM

## 2012-07-09 ENCOUNTER — Telehealth: Payer: Self-pay

## 2012-07-09 NOTE — Telephone Encounter (Signed)
Pt called requesting a call back from [place] with questions regarding recent CT scan

## 2012-07-10 LAB — HM DEXA SCAN: HM DEXA SCAN: -2.8

## 2012-07-10 NOTE — Telephone Encounter (Signed)
LEFT MESSAGE ON PATIENT HOME # OF RETURNED CALL TO HER FOR HER CT SCAN QUESTION.

## 2012-07-17 ENCOUNTER — Encounter: Payer: Self-pay | Admitting: Internal Medicine

## 2012-07-17 ENCOUNTER — Ambulatory Visit (INDEPENDENT_AMBULATORY_CARE_PROVIDER_SITE_OTHER): Payer: Medicare Other | Admitting: Internal Medicine

## 2012-07-17 VITALS — BP 112/64 | HR 102 | Temp 97.7°F | Wt 123.8 lb

## 2012-07-17 DIAGNOSIS — R918 Other nonspecific abnormal finding of lung field: Secondary | ICD-10-CM

## 2012-07-17 NOTE — Progress Notes (Signed)
  Subjective:    Patient ID: [name], female    DOB: 01-07-1941, 71 y.o.   MRN: [id number]  HPI Patient to discuss CT findings: COPD, CAD, lung nodule - spiculated 8 mm nodule.  PMH, FamHx and SocHx reviewed for any changes and relevance.  Mes - no change since last visit  Review of Systems System review is negative for any constitutional, cardiac, pulmonary, GI or neuro symptoms or complaints other than as described in the HPI.     Objective:   Physical Exam Filed Vitals:   07/17/12 1316  BP: 112/64  Pulse: 102  Temp: 97.7 F (36.5 C)          Assessment & Plan:

## 2012-07-19 NOTE — Assessment & Plan Note (Signed)
Reviewed CT report and images with the patient with full explanation of findings including nodules, COPD changes, vascular calcifications.  Plan Smoking cessation strongly recommended  Reassured that calcifications are not equivalent to CAD  F/u CT in 3 months - Dec 9th, 2014  (greater than 50% of 20 min visit spent on education and counseling)

## 2012-07-21 ENCOUNTER — Encounter: Payer: Self-pay | Admitting: Internal Medicine

## 2012-10-06 ENCOUNTER — Ambulatory Visit (INDEPENDENT_AMBULATORY_CARE_PROVIDER_SITE_OTHER)
Admission: RE | Admit: 2012-10-06 | Discharge: 2012-10-06 | Disposition: A | Discharge status: Home / Self Care | Payer: Medicare Other | Source: Ambulatory Visit | Attending: Internal Medicine | Admitting: Internal Medicine

## 2012-10-06 DIAGNOSIS — R918 Other nonspecific abnormal finding of lung field: Secondary | ICD-10-CM

## 2012-10-08 ENCOUNTER — Telehealth: Payer: Self-pay

## 2012-10-08 NOTE — Telephone Encounter (Signed)
The radiologist says the nodule is smaller and therefore benign BUT the radiologist recommends a repeat CT scan in 9 months

## 2012-10-08 NOTE — Telephone Encounter (Signed)
Pt called requesting results of CT scan completed 12/09, please advise in MEN's absence, thanks!

## 2012-10-09 ENCOUNTER — Telehealth: Payer: Self-pay | Admitting: *Deleted

## 2012-10-09 NOTE — Telephone Encounter (Signed)
Called pt and let her know that the nodule reduced in size, radiologist interprets this as indicative of a benign nodule. Pt pleased.

## 2012-10-09 NOTE — Telephone Encounter (Signed)
Pt called requesting results from her CT scan. Please advise.

## 2012-10-09 NOTE — Telephone Encounter (Signed)
Nodule reduced in size - radiologist interprets this as indicative of a BENIGN nodule.

## 2012-10-13 NOTE — Telephone Encounter (Signed)
Notified pt with md response. Pt is wanting a copy ailing to pt address...[name]

## 2012-10-15 ENCOUNTER — Other Ambulatory Visit: Payer: Self-pay | Admitting: Internal Medicine

## 2012-10-15 ENCOUNTER — Telehealth: Payer: Self-pay | Admitting: Internal Medicine

## 2012-10-15 DIAGNOSIS — R918 Other nonspecific abnormal finding of lung field: Secondary | ICD-10-CM

## 2012-10-15 NOTE — Telephone Encounter (Signed)
ok 

## 2012-10-15 NOTE — Telephone Encounter (Signed)
Patient would like to switch providers from Dr. Debby Bud to Dr. Yetta Barre because she "no longer knows how to take Dr. Debby Bud" she feels like "he does not like her" and she would feel more comfortable seeing someone else, please advise

## 2012-10-15 NOTE — Telephone Encounter (Signed)
1. Ok with me to change 2. Per last OV note she is due for f/u CT chest - order placed. Please inform patient that study will be scheduled.

## 2012-10-16 NOTE — Telephone Encounter (Signed)
Pt scheduled with Dr. Jones.

## 2012-10-17 ENCOUNTER — Inpatient Hospital Stay: Admission: RE | Admit: 2012-10-17 | Payer: Medicare Other | Source: Ambulatory Visit

## 2012-11-27 ENCOUNTER — Ambulatory Visit (INDEPENDENT_AMBULATORY_CARE_PROVIDER_SITE_OTHER): Payer: Medicare Other | Admitting: Internal Medicine

## 2012-11-27 ENCOUNTER — Encounter: Payer: Self-pay | Admitting: Internal Medicine

## 2012-11-27 VITALS — BP 140/80 | HR 92 | Temp 97.5°F | Resp 16 | Wt 127.0 lb

## 2012-11-27 DIAGNOSIS — J329 Chronic sinusitis, unspecified: Secondary | ICD-10-CM | POA: Insufficient documentation

## 2012-11-27 DIAGNOSIS — J449 Chronic obstructive pulmonary disease, unspecified: Secondary | ICD-10-CM

## 2012-11-27 DIAGNOSIS — B9689 Other specified bacterial agents as the cause of diseases classified elsewhere: Secondary | ICD-10-CM

## 2012-11-27 DIAGNOSIS — A499 Bacterial infection, unspecified: Secondary | ICD-10-CM

## 2012-11-27 MED ORDER — CEFUROXIME AXETIL 500 MG PO TABS: 500.0000 mg | ORAL_TABLET | Freq: Two times a day (BID) | ORAL | Status: DC

## 2012-11-27 NOTE — Assessment & Plan Note (Signed)
She will continue with decongestants as needed She will start ceftin for the infection

## 2012-11-27 NOTE — Patient Instructions (Signed)

## 2012-11-27 NOTE — Assessment & Plan Note (Signed)
She does not want to treat this at this time

## 2012-11-27 NOTE — Progress Notes (Signed)
Subjective:    Patient ID: [name], female    DOB: 1940/12/30, 72 y.o.   MRN: [id number]  Sinusitis This is a new problem. The current episode started 1 to 4 weeks ago. The problem has been gradually worsening since onset. There has been no fever. The fever has been present for less than 1 day. Her pain is at a severity of 0/10. She is experiencing no pain. Associated symptoms include chills, congestion, sinus pressure and a sore throat. Pertinent negatives include no coughing, diaphoresis, ear pain, headaches, hoarse voice, neck pain, shortness of breath, sneezing or swollen glands. Past treatments include spray decongestants and oral decongestants. The treatment provided moderate relief.      Review of Systems  Constitutional: Positive for chills. Negative for fever, diaphoresis, activity change, appetite change, fatigue and unexpected weight change.  HENT: Positive for congestion, sore throat, rhinorrhea, postnasal drip and sinus pressure. Negative for ear pain, nosebleeds, hoarse voice, facial swelling, sneezing, trouble swallowing, neck pain, voice change and ear discharge.   Eyes: Negative.   Respiratory: Negative for apnea, cough, choking, chest tightness, shortness of breath, wheezing and stridor.   Cardiovascular: Negative for chest pain, palpitations and leg swelling.  Gastrointestinal: Negative.  Negative for nausea, vomiting, abdominal pain, diarrhea, constipation and blood in stool.  Genitourinary: Negative.   Musculoskeletal: Negative.   Skin: Negative.   Neurological: Negative for dizziness, tremors, weakness, light-headedness and headaches.  Hematological: Negative for adenopathy. Does not bruise/bleed easily.  Psychiatric/Behavioral: Negative.        Objective:   Physical Exam  Vitals reviewed. Constitutional: She is oriented to person, place, and time. She appears well-developed and well-nourished.  Non-toxic appearance. She does not have a sickly appearance.  She does not appear ill. No distress.  HENT:  Head: Normocephalic and atraumatic. No trismus in the jaw.  Right Ear: Hearing, tympanic membrane, external ear and ear canal normal.  Left Ear: Hearing, tympanic membrane, external ear and ear canal normal.  Nose: No mucosal edema, rhinorrhea, sinus tenderness or nasal deformity. Right sinus exhibits maxillary sinus tenderness. Right sinus exhibits no frontal sinus tenderness. Left sinus exhibits maxillary sinus tenderness. Left sinus exhibits no frontal sinus tenderness.  Mouth/Throat: Uvula is midline and mucous membranes are normal. Mucous membranes are not pale, not dry and not cyanotic. She does not have dentures. No oral lesions. Normal dentition. No dental abscesses, uvula swelling, lacerations or dental caries. Posterior oropharyngeal erythema present. No oropharyngeal exudate, posterior oropharyngeal edema or tonsillar abscesses.  Eyes: Conjunctivae normal are normal. Right eye exhibits no discharge. Left eye exhibits no discharge. No scleral icterus.  Neck: Normal range of motion. Neck supple. No JVD present. No tracheal deviation present. No thyromegaly present.  Cardiovascular: Normal rate, regular rhythm, normal heart sounds and intact distal pulses.  Exam reveals no gallop and no friction rub.   No murmur heard. Pulmonary/Chest: Effort normal and breath sounds normal. No stridor. No respiratory distress. She has no wheezes. She has no rales. She exhibits no tenderness.  Abdominal: Soft. Bowel sounds are normal. She exhibits no distension and no mass. There is no tenderness. There is no rebound and no guarding.  Musculoskeletal: Normal range of motion. She exhibits no edema and no tenderness.  Lymphadenopathy:    She has no cervical adenopathy.  Neurological: She is oriented to person, place, and time.  Skin: Skin is warm and dry. No rash noted. She is not diaphoretic. No erythema. No pallor.  Psychiatric: She has a normal mood and  affect.  Her behavior is normal. Judgment and thought content normal.          Assessment & Plan:

## 2012-12-10 ENCOUNTER — Other Ambulatory Visit (INDEPENDENT_AMBULATORY_CARE_PROVIDER_SITE_OTHER): Payer: Medicare Other

## 2012-12-10 ENCOUNTER — Ambulatory Visit (INDEPENDENT_AMBULATORY_CARE_PROVIDER_SITE_OTHER): Payer: Medicare Other | Admitting: Internal Medicine

## 2012-12-10 ENCOUNTER — Encounter: Payer: Self-pay | Admitting: Internal Medicine

## 2012-12-10 VITALS — BP 120/70 | HR 92 | Temp 97.7°F | Resp 16 | Wt 128.8 lb

## 2012-12-10 DIAGNOSIS — R7309 Other abnormal glucose: Secondary | ICD-10-CM

## 2012-12-10 DIAGNOSIS — R739 Hyperglycemia, unspecified: Secondary | ICD-10-CM | POA: Insufficient documentation

## 2012-12-10 DIAGNOSIS — R509 Fever, unspecified: Secondary | ICD-10-CM

## 2012-12-10 DIAGNOSIS — J329 Chronic sinusitis, unspecified: Secondary | ICD-10-CM

## 2012-12-10 DIAGNOSIS — R918 Other nonspecific abnormal finding of lung field: Secondary | ICD-10-CM

## 2012-12-10 DIAGNOSIS — A499 Bacterial infection, unspecified: Secondary | ICD-10-CM

## 2012-12-10 LAB — URINALYSIS, ROUTINE W REFLEX MICROSCOPIC
Specific Gravity, Urine: 1.015 (ref 1.000–1.030)
Total Protein, Urine: NEGATIVE
Urine Glucose: NEGATIVE

## 2012-12-10 LAB — CBC WITH DIFFERENTIAL/PLATELET
Basophils Absolute: 0.1 10*3/uL (ref 0.0–0.1)
HCT: 42.6 % (ref 36.0–46.0)
Lymphs Abs: 2.2 10*3/uL (ref 0.7–4.0)
Monocytes Absolute: 0.4 10*3/uL (ref 0.1–1.0)
Monocytes Relative: 4.7 % (ref 3.0–12.0)
Neutrophils Relative %: 65.2 % (ref 43.0–77.0)
Platelets: 425 10*3/uL — ABNORMAL HIGH (ref 150.0–400.0)
RDW: 13.6 % (ref 11.5–14.6)

## 2012-12-10 LAB — COMPREHENSIVE METABOLIC PANEL
ALT: 12 U/L (ref 0–35)
Alkaline Phosphatase: 62 U/L (ref 39–117)
CO2: 29 mEq/L (ref 19–32)
Sodium: 133 mEq/L — ABNORMAL LOW (ref 135–145)
Total Bilirubin: 0.5 mg/dL (ref 0.3–1.2)
Total Protein: 8 g/dL (ref 6.0–8.3)

## 2012-12-10 NOTE — Patient Instructions (Signed)
Fever  °Fever is a higher-than-normal body temperature. A normal temperature varies with: °· Age. °· How it is measured (mouth, underarm, rectal, or ear). °· Time of day. °In an adult, an oral temperature around 98.6° Fahrenheit (F) or 37° Celsius (C) is considered normal. A rise in temperature of about 1.8° F or 1° C is generally considered a fever (100.4° F or 38° C). In an infant age 71 days or less, a rectal temperature of 100.4° F (38° C) generally is regarded as fever. Fever is not a disease but can be a symptom of illness. °CAUSES  °· Fever is most commonly caused by infection. °· Some non-infectious problems can cause fever. For example: °· Some arthritis problems. °· Problems with the thyroid or adrenal glands. °· Immune system problems. °· Some kinds of cancer. °· A reaction to certain medicines. °· Occasionally, the source of a fever cannot be determined. This is sometimes called a "Fever of Unknown Origin" (FUO). °· Some situations may lead to a temporary rise in body temperature that may go away on its own. Examples are: °· Childbirth. °· Surgery. °· Some situations may cause a rise in body temperature but these are not considered "true fever". Examples are: °· Intense exercise. °· Dehydration. °· Exposure to high outside or room temperatures. °SYMPTOMS  °· Feeling warm or hot. °· Fatigue or feeling exhausted. °· Aching all over. °· Chills. °· Shivering. °· Sweats. °DIAGNOSIS  °A fever can be suspected by your caregiver feeling that your skin is unusually warm. The fever is confirmed by taking a temperature with a thermometer. Temperatures can be taken different ways. Some methods are accurate and some are not: °With adults, adolescents, and children:  °· An oral temperature is used most commonly. °· An ear thermometer will only be accurate if it is positioned as recommended by the manufacturer. °· Under the arm temperatures are not accurate and not recommended. °· Most electronic thermometers are fast  and accurate. °Infants and Toddlers: °· Rectal temperatures are recommended and most accurate. °· Ear temperatures are not accurate in this age group and are not recommended. °· Skin thermometers are not accurate. °RISKS AND COMPLICATIONS  °· During a fever, the body uses more oxygen, so a person with a fever may develop rapid breathing or shortness of breath. This can be dangerous especially in people with heart or lung disease. °· The sweats that occur following a fever can cause dehydration. °· High fever can cause seizures in infants and children. °· Older persons can develop confusion during a fever. °TREATMENT  °· Medications may be used to control temperature. °· Do not give aspirin to children with fevers. There is an association with Reye's syndrome. Reye's syndrome is a rare but potentially deadly disease. °· If an infection is present and medications have been prescribed, take them as directed. Finish the full course of medications until they are gone. °· Sponging or bathing with room-temperature water may help reduce body temperature. Do not use ice water or alcohol sponge baths. °· Do not over-bundle children in blankets or heavy clothes. °· Drinking adequate fluids during an illness with fever is important to prevent dehydration. °HOME CARE INSTRUCTIONS  °· For adults, rest and adequate fluid intake are important. Dress according to how you feel, but do not over-bundle. °· Drink enough water and/or fluids to keep your urine clear or pale yellow. °· For infants over 3 months and children, giving medication as directed by your caregiver to control fever can   help with comfort. The amount to be given is based on the child's weight. Do NOT give more than is recommended. °SEEK MEDICAL CARE IF:  °· You or your child are unable to keep fluids down. °· Vomiting or diarrhea develops. °· You develop a skin rash. °· An oral temperature above 102° F (38.9° C) develops, or a fever which persists for over 3  days. °· You develop excessive weakness, dizziness, fainting or extreme thirst. °· Fevers keep coming back after 3 days. °SEEK IMMEDIATE MEDICAL CARE IF:  °· Shortness of breath or trouble breathing develops °· You pass out. °· You feel you are making little or no urine. °· New pain develops that was not there before (such as in the head, neck, chest, back, or abdomen). °· You cannot hold down fluids. °· Vomiting and diarrhea persist for more than a day or two. °· You develop a stiff neck and/or your eyes become sensitive to light. °· An unexplained temperature above 102° F (38.9° C) develops. °Document Released: 10/15/2005 Document Revised: 01/07/2012 Document Reviewed: 09/30/2008 °[employer]® Patient Information ©2013 [employer]. ° °

## 2012-12-10 NOTE — Assessment & Plan Note (Signed)
Repeat CXR today 

## 2012-12-10 NOTE — Assessment & Plan Note (Signed)
She will complete the course of levaquin I will check plain films of her sinuses to look for A/F levels

## 2012-12-10 NOTE — Progress Notes (Signed)
Subjective:    Patient ID: [name], female    DOB: 10/08/1941, 72 y.o.   MRN: [id number]  Fever  This is a recurrent problem. The current episode started 1 to 4 weeks ago. The problem occurs intermittently. The problem has been unchanged. The maximum temperature noted was 99 to 99.9 F. The temperature was taken using an oral thermometer. Associated symptoms include congestion (nasal) and nausea. Pertinent negatives include no abdominal pain, chest pain, coughing, diarrhea, ear pain, headaches, muscle aches, rash, sleepiness, sore throat, urinary pain, vomiting or wheezing. Treatments tried: ceftin and levaquin. The treatment provided moderate relief.      Review of Systems  Constitutional: Positive for fever, chills and fatigue. Negative for diaphoresis, activity change, appetite change and unexpected weight change.  HENT: Positive for congestion (nasal). Negative for ear pain, nosebleeds, sore throat, facial swelling, rhinorrhea, sneezing, mouth sores, trouble swallowing, neck pain, neck stiffness, dental problem, voice change, postnasal drip and sinus pressure.   Respiratory: Negative for cough, shortness of breath and wheezing.   Cardiovascular: Negative for chest pain.  Gastrointestinal: Positive for nausea. Negative for vomiting, abdominal pain, diarrhea, constipation and rectal pain.  Genitourinary: Negative for dysuria, hematuria, flank pain, decreased urine volume, difficulty urinating and pelvic pain.  Musculoskeletal: Negative for myalgias, back pain, joint swelling and gait problem.  Skin: Negative for color change, pallor, rash and wound.  Neurological: Positive for weakness (all over). Negative for dizziness, syncope, light-headedness, numbness and headaches.  Hematological: Negative for adenopathy. Does not bruise/bleed easily.  Psychiatric/Behavioral: Negative.        Objective:   Physical Exam  Vitals reviewed. Constitutional: She is oriented to person, place, and  time. She appears well-developed and well-nourished.  Non-toxic appearance. She does not have a sickly appearance. She does not appear ill. No distress.  HENT:  Head: Normocephalic. No trismus in the jaw.  Right Ear: Hearing, tympanic membrane, external ear and ear canal normal.  Left Ear: Hearing, tympanic membrane, external ear and ear canal normal.  Nose: No mucosal edema or rhinorrhea. Right sinus exhibits no maxillary sinus tenderness and no frontal sinus tenderness. Left sinus exhibits no maxillary sinus tenderness and no frontal sinus tenderness.  Mouth/Throat: Oropharynx is clear and moist and mucous membranes are normal. Mucous membranes are not pale, not dry and not cyanotic. No oral lesions. No edematous. No oropharyngeal exudate, posterior oropharyngeal edema, posterior oropharyngeal erythema or tonsillar abscesses.  Eyes: Conjunctivae are normal. Right eye exhibits no discharge. Left eye exhibits no discharge. No scleral icterus.  Neck: Normal range of motion. Neck supple. No JVD present. No tracheal deviation present. No thyromegaly present.  Cardiovascular: Normal rate, regular rhythm, normal heart sounds and intact distal pulses.  Exam reveals no gallop and no friction rub.   No murmur heard. Pulmonary/Chest: Effort normal and breath sounds normal. No stridor. No respiratory distress. She has no wheezes. She has no rales. She exhibits no tenderness.  Abdominal: Soft. Bowel sounds are normal. She exhibits no distension and no mass. There is no tenderness. There is no rebound and no guarding.  Musculoskeletal: Normal range of motion. She exhibits no edema and no tenderness.  Lymphadenopathy:       Head (right side): No submental, no submandibular, no tonsillar, no preauricular, no posterior auricular and no occipital adenopathy present.       Head (left side): No submental, no submandibular, no tonsillar, no preauricular, no posterior auricular and no occipital adenopathy present.     She has no cervical  adenopathy.    She has no axillary adenopathy.       Right: No inguinal and no supraclavicular adenopathy present.       Left: No inguinal and no supraclavicular adenopathy present.  Neurological: She is oriented to person, place, and time.  Skin: Skin is warm and dry. No rash noted. She is not diaphoretic. No erythema. No pallor.  Psychiatric: She has a normal mood and affect. Her behavior is normal. Judgment and thought content normal.     Lab Results  Component Value Date   WBC 8.1 05/08/2011   HGB 14.5 05/08/2011   HCT 42.6 05/08/2011   PLT 384.0 05/08/2011   GLUCOSE 107* 06/24/2012   CHOL 146 05/08/2011   TRIG 55.0 05/08/2011   HDL 49.70 05/08/2011   LDLCALC 85 05/08/2011   ALT 11 06/24/2012   AST 11 06/24/2012   NA 136 06/24/2012   K 4.8 06/24/2012   CL 101 06/24/2012   CREATININE 0.7 06/24/2012   BUN 12 06/24/2012   CO2 28 06/24/2012   TSH 0.95 05/08/2011   INR 1.0 04/22/2009       Assessment & Plan:

## 2012-12-10 NOTE — Assessment & Plan Note (Signed)
I will check her labs to look for evidence of infection, inflammation, malignancy She will get a CXR and sinus films done today to look for sources of infection She will complete the course of levaquin I do not see anything today that needs to be treated

## 2012-12-10 NOTE — Assessment & Plan Note (Signed)
I will check her A1C to see if she has developed DM2 

## 2012-12-11 ENCOUNTER — Ambulatory Visit: Payer: Medicare Other | Admitting: Internal Medicine

## 2012-12-16 ENCOUNTER — Telehealth: Payer: Self-pay

## 2012-12-16 NOTE — Telephone Encounter (Signed)
OK to fill this prescription with additional refills x1 Thank you!  

## 2012-12-16 NOTE — Telephone Encounter (Signed)
Faxed refill for clorazepate 7.5mg  (1/2 tab qhs and 1/2 prn) #30. Please advise if ok to refill for this Jones pt

## 2012-12-17 MED ORDER — CLORAZEPATE DIPOTASSIUM 7.5 MG PO TABS: ORAL_TABLET | ORAL | Status: DC

## 2012-12-29 ENCOUNTER — Telehealth: Payer: Self-pay | Admitting: Internal Medicine

## 2012-12-29 NOTE — Telephone Encounter (Signed)
Caller: Fauna/Patient; Phone: [phone]; Reason for Call: Patient concerned about weather tomorrow and making her lab appt 12/30/12 at 08: 45.  Concerned of ice/sleet would like to rescedule.  PLEASE CONTACT PATIENT FOR RESCHEDULE OF LAB APPT .  [phone]

## 2012-12-29 NOTE — Telephone Encounter (Signed)
Appt has been changed to Wed. March 5.

## 2012-12-30 ENCOUNTER — Ambulatory Visit: Payer: Medicare Other | Admitting: Internal Medicine

## 2012-12-31 ENCOUNTER — Ambulatory Visit (INDEPENDENT_AMBULATORY_CARE_PROVIDER_SITE_OTHER): Payer: Medicare Other | Admitting: Internal Medicine

## 2012-12-31 ENCOUNTER — Other Ambulatory Visit (INDEPENDENT_AMBULATORY_CARE_PROVIDER_SITE_OTHER): Payer: Medicare Other

## 2012-12-31 ENCOUNTER — Encounter: Payer: Self-pay | Admitting: Internal Medicine

## 2012-12-31 VITALS — BP 116/70 | HR 80 | Temp 97.7°F | Resp 16

## 2012-12-31 DIAGNOSIS — J309 Allergic rhinitis, unspecified: Secondary | ICD-10-CM | POA: Insufficient documentation

## 2012-12-31 DIAGNOSIS — D473 Essential (hemorrhagic) thrombocythemia: Secondary | ICD-10-CM

## 2012-12-31 DIAGNOSIS — E875 Hyperkalemia: Secondary | ICD-10-CM | POA: Insufficient documentation

## 2012-12-31 LAB — CBC WITH DIFFERENTIAL/PLATELET
Basophils Relative: 1.3 % (ref 0.0–3.0)
Eosinophils Absolute: 0.1 10*3/uL (ref 0.0–0.7)
Eosinophils Relative: 1.1 % (ref 0.0–5.0)
Lymphocytes Relative: 30.9 % (ref 12.0–46.0)
MCHC: 33.5 g/dL (ref 30.0–36.0)
Neutrophils Relative %: 61.2 % (ref 43.0–77.0)
RBC: 5.04 Mil/uL (ref 3.87–5.11)
WBC: 8.4 10*3/uL (ref 4.5–10.5)

## 2012-12-31 LAB — BASIC METABOLIC PANEL
BUN: 15 mg/dL (ref 6–23)
Chloride: 100 mEq/L (ref 96–112)
Potassium: 4.4 mEq/L (ref 3.5–5.1)

## 2012-12-31 MED ORDER — MOMETASONE FUROATE 50 MCG/ACT NA SUSP: 2.0000 | Freq: Every day | NASAL | Status: DC

## 2012-12-31 MED ORDER — ASPIRIN 81 MG PO TBEC: 81.0000 mg | DELAYED_RELEASE_TABLET | Freq: Every day | ORAL | Status: DC

## 2012-12-31 NOTE — Patient Instructions (Signed)
Allergic Rhinitis  Allergic rhinitis is when the mucous membranes in the nose respond to allergens. Allergens are particles in the air that cause your body to have an allergic reaction. This causes you to release allergic antibodies. Through a chain of events, these eventually cause you to release histamine into the blood stream (hence the use of antihistamines). Although meant to be protective to the body, it is this release that causes your discomfort, such as frequent sneezing, congestion and an itchy runny nose.    CAUSES    The pollen allergens may come from grasses, trees, and weeds. This is seasonal allergic rhinitis, or "hay fever." Other allergens cause year-round allergic rhinitis (perennial allergic rhinitis) such as house dust mite allergen, pet dander and mold spores.    SYMPTOMS     Nasal stuffiness (congestion).   Runny, itchy nose with sneezing and tearing of the eyes.   There is often an itching of the mouth, eyes and ears.  It cannot be cured, but it can be controlled with medications.  DIAGNOSIS    If you are unable to determine the offending allergen, skin or blood testing may find it.  TREATMENT     Avoid the allergen.   Medications and allergy shots (immunotherapy) can help.   Hay fever may often be treated with antihistamines in pill or nasal spray forms. Antihistamines block the effects of histamine. There are over-the-counter medicines that may help with nasal congestion and swelling around the eyes. Check with your caregiver before taking or giving this medicine.  If the treatment above does not work, there are many new medications your caregiver can prescribe. Stronger medications may be used if initial measures are ineffective. Desensitizing injections can be used if medications and avoidance fails. Desensitization is when a patient is given ongoing shots until the body becomes less sensitive to the allergen. Make sure you follow up with your caregiver if problems continue.   SEEK MEDICAL CARE IF:     You develop fever (more than 100.5 F (38.1 C).   You develop a cough that does not stop easily (persistent).   You have shortness of breath.   You start wheezing.   Symptoms interfere with normal daily activities.  Document Released: 07/10/2001 Document Revised: 01/07/2012 Document Reviewed: 01/19/2009  [employer] Patient Information 2013 [employer].

## 2013-01-01 ENCOUNTER — Encounter: Payer: Self-pay | Admitting: Internal Medicine

## 2013-01-01 NOTE — Assessment & Plan Note (Signed)
Her K+ level is normal today 

## 2013-01-01 NOTE — Progress Notes (Signed)
Subjective:    Patient ID: [name], female    DOB: 14-Aug-1941, 72 y.o.   MRN: [id number]  Allergic Reaction This is a recurrent problem. The current episode started more than 1 week ago. The problem occurs constantly. The problem is unchanged. The problem is mild. Pertinent negatives include no abdominal pain, chest pain, chest pressure, coughing, diarrhea, difficulty breathing, drooling, eye itching, eye redness, eye watering, globus sensation, hyperventilation, itching, rash, stridor, trouble swallowing, vomiting or wheezing. Past treatments include nothing (allegra). The treatment provided moderate relief. Her past medical history is significant for atopic dermatitis and seasonal allergies.      Review of Systems  Constitutional: Negative.   HENT: Positive for congestion, rhinorrhea, sneezing and postnasal drip. Negative for ear pain, nosebleeds, facial swelling, drooling, trouble swallowing, neck pain, neck stiffness, dental problem, voice change, sinus pressure and ear discharge.   Eyes: Negative.  Negative for redness and itching.  Respiratory: Negative for cough, chest tightness, shortness of breath, wheezing and stridor.   Cardiovascular: Negative for chest pain.  Gastrointestinal: Negative for vomiting, abdominal pain and diarrhea.  Endocrine: Negative.   Genitourinary: Negative.   Musculoskeletal: Negative for myalgias, back pain, joint swelling, arthralgias and gait problem.  Skin: Negative for color change, itching, pallor, rash and wound.  Allergic/Immunologic: Negative.   Neurological: Negative for dizziness, facial asymmetry, weakness, numbness and headaches.  Hematological: Negative for adenopathy. Does not bruise/bleed easily.  Psychiatric/Behavioral: Negative.        Objective:   Physical Exam  Vitals reviewed. Constitutional: She is oriented to person, place, and time. She appears well-developed and well-nourished.  Non-toxic appearance. She does not have a  sickly appearance. She does not appear ill. No distress.  HENT:  Head: Normocephalic and atraumatic.  Right Ear: Hearing, tympanic membrane, external ear and ear canal normal.  Left Ear: Hearing, tympanic membrane, external ear and ear canal normal.  Nose: Mucosal edema and rhinorrhea present. No nose lacerations, sinus tenderness, nasal deformity, septal deviation or nasal septal hematoma. No epistaxis.  No foreign bodies. Right sinus exhibits no maxillary sinus tenderness and no frontal sinus tenderness. Left sinus exhibits no maxillary sinus tenderness and no frontal sinus tenderness.  Mouth/Throat: Oropharynx is clear and moist and mucous membranes are normal. Mucous membranes are not pale, not dry and not cyanotic. No oropharyngeal exudate, posterior oropharyngeal edema, posterior oropharyngeal erythema or tonsillar abscesses.  Eyes: Conjunctivae are normal. Right eye exhibits no discharge. Left eye exhibits no discharge. No scleral icterus.  Neck: Normal range of motion. Neck supple. No JVD present. No tracheal deviation present. No thyromegaly present.  Cardiovascular: Normal rate, regular rhythm, normal heart sounds and intact distal pulses.  Exam reveals no gallop and no friction rub.   No murmur heard. Pulmonary/Chest: Effort normal and breath sounds normal. No stridor. No respiratory distress. She has no wheezes. She has no rales. She exhibits no tenderness.  Abdominal: Soft. Bowel sounds are normal. She exhibits no distension and no mass. There is no tenderness. There is no rebound and no guarding.  Musculoskeletal: Normal range of motion. She exhibits no edema and no tenderness.  Lymphadenopathy:    She has no cervical adenopathy.  Neurological: She is oriented to person, place, and time.  Skin: Skin is warm and dry. No rash noted. She is not diaphoretic. No erythema. No pallor.  Psychiatric: She has a normal mood and affect. Her behavior is normal. Judgment and thought content normal.       Lab Results  Component  Value Date   WBC 8.4 12/31/2012   HGB 14.6 12/31/2012   HCT 43.7 12/31/2012   PLT 386.0 12/31/2012   GLUCOSE 91 12/31/2012   CHOL 146 05/08/2011   TRIG 55.0 05/08/2011   HDL 49.70 05/08/2011   LDLCALC 85 05/08/2011   ALT 12 12/10/2012   AST 19 12/10/2012   NA 136 12/31/2012   K 4.4 12/31/2012   CL 100 12/31/2012   CREATININE 0.9 12/31/2012   BUN 15 12/31/2012   CO2 29 12/31/2012   TSH 0.95 05/08/2011   INR 1.0 04/22/2009   HGBA1C 5.7 12/10/2012      Assessment & Plan:

## 2013-01-01 NOTE — Assessment & Plan Note (Signed)
Repeat CBC is normal today I will check her SPEP to see if there is any evidence of cancer

## 2013-01-01 NOTE — Assessment & Plan Note (Signed)
Some improvement noted today

## 2013-01-01 NOTE — Assessment & Plan Note (Signed)
I have asked her to start nasonex ns She will continue allegra

## 2013-01-05 ENCOUNTER — Encounter: Payer: Self-pay | Admitting: Internal Medicine

## 2013-01-05 LAB — PROTEIN ELECTROPHORESIS, SERUM
Alpha-2-Globulin: 10.2 % (ref 7.1–11.8)
Beta Globulin: 6.3 % (ref 4.7–7.2)
Total Protein, Serum Electrophoresis: 7.6 g/dL (ref 6.0–8.3)

## 2013-01-16 ENCOUNTER — Other Ambulatory Visit: Payer: Self-pay

## 2013-01-16 MED ORDER — ESTROGENS CONJUGATED 0.3 MG PO TABS: ORAL_TABLET | ORAL | Status: DC

## 2013-03-03 ENCOUNTER — Encounter: Payer: Self-pay | Admitting: Internal Medicine

## 2013-03-03 ENCOUNTER — Ambulatory Visit (INDEPENDENT_AMBULATORY_CARE_PROVIDER_SITE_OTHER): Payer: Medicare Other | Admitting: Internal Medicine

## 2013-03-03 VITALS — BP 110/66 | HR 80 | Temp 97.8°F | Resp 16 | Wt 126.0 lb

## 2013-03-03 DIAGNOSIS — K589 Irritable bowel syndrome without diarrhea: Secondary | ICD-10-CM

## 2013-03-03 DIAGNOSIS — F411 Generalized anxiety disorder: Secondary | ICD-10-CM

## 2013-03-03 DIAGNOSIS — R918 Other nonspecific abnormal finding of lung field: Secondary | ICD-10-CM

## 2013-03-03 DIAGNOSIS — J309 Allergic rhinitis, unspecified: Secondary | ICD-10-CM

## 2013-03-03 DIAGNOSIS — D473 Essential (hemorrhagic) thrombocythemia: Secondary | ICD-10-CM

## 2013-03-03 MED ORDER — ESTROGENS CONJUGATED 0.3 MG PO TABS: ORAL_TABLET | ORAL | Status: DC

## 2013-03-03 MED ORDER — CLORAZEPATE DIPOTASSIUM 7.5 MG PO TABS: ORAL_TABLET | ORAL | Status: DC

## 2013-03-03 NOTE — Progress Notes (Signed)
  Subjective:    Patient ID: [name], female    DOB: 17-Dec-1940, 72 y.o.   MRN: [id number]  HPI  She returns for f/up and tells me that she feels well, she has a runny nose today but no other complaints.   Review of Systems  Constitutional: Negative.   HENT: Positive for congestion, rhinorrhea and postnasal drip. Negative for nosebleeds, facial swelling and sneezing.   Eyes: Negative.   Respiratory: Negative.   Cardiovascular: Negative for chest pain, palpitations and leg swelling.  Gastrointestinal: Negative.  Negative for nausea, vomiting, abdominal pain, diarrhea and constipation.  Endocrine: Negative.   Genitourinary: Negative.   Musculoskeletal: Negative.   Skin: Negative.   Allergic/Immunologic: Negative.   Neurological: Negative.   Hematological: Negative.   Psychiatric/Behavioral: Negative for suicidal ideas, hallucinations, behavioral problems, confusion, sleep disturbance, self-injury, dysphoric mood, decreased concentration and agitation. The patient is nervous/anxious. The patient is not hyperactive.        Objective:   Physical Exam  Vitals reviewed. Constitutional: She is oriented to person, place, and time. She appears well-developed and well-nourished.  Non-toxic appearance. She does not have a sickly appearance. She does not appear ill. No distress.  HENT:  Head: Normocephalic and atraumatic. No trismus in the jaw.  Nose: Mucosal edema and rhinorrhea present. No nose lacerations, sinus tenderness, nasal deformity, septal deviation or nasal septal hematoma. No epistaxis.  No foreign bodies. Right sinus exhibits no maxillary sinus tenderness and no frontal sinus tenderness. Left sinus exhibits no maxillary sinus tenderness and no frontal sinus tenderness.  Mouth/Throat: Oropharynx is clear and moist and mucous membranes are normal. Mucous membranes are not pale, not dry and not cyanotic. No oral lesions. No edematous. No oropharyngeal exudate.  Eyes:  Conjunctivae are normal. Right eye exhibits no discharge. Left eye exhibits no discharge. No scleral icterus.  Neck: Normal range of motion. Neck supple. No JVD present. No tracheal deviation present. No thyromegaly present.  Cardiovascular: Normal rate, regular rhythm, normal heart sounds and intact distal pulses.  Exam reveals no gallop and no friction rub.   No murmur heard. Pulmonary/Chest: Effort normal and breath sounds normal. No stridor. No respiratory distress. She has no wheezes. She has no rales. She exhibits no tenderness.  Abdominal: Soft. Bowel sounds are normal. She exhibits no distension and no mass. There is no tenderness. There is no rebound and no guarding.  Musculoskeletal: Normal range of motion. She exhibits no edema and no tenderness.  Lymphadenopathy:    She has no cervical adenopathy.  Neurological: She is oriented to person, place, and time.  Skin: Skin is warm and dry. No rash noted. She is not diaphoretic. No erythema. No pallor.  Psychiatric: She has a normal mood and affect. Her behavior is normal. Judgment and thought content normal.     Lab Results  Component Value Date   WBC 8.4 12/31/2012   HGB 14.6 12/31/2012   HCT 43.7 12/31/2012   PLT 386.0 12/31/2012   GLUCOSE 91 12/31/2012   CHOL 146 05/08/2011   TRIG 55.0 05/08/2011   HDL 49.70 05/08/2011   LDLCALC 85 05/08/2011   ALT 12 12/10/2012   AST 19 12/10/2012   NA 136 12/31/2012   K 4.4 12/31/2012   CL 100 12/31/2012   CREATININE 0.9 12/31/2012   BUN 15 12/31/2012   CO2 29 12/31/2012   TSH 0.95 05/08/2011   INR 1.0 04/22/2009   HGBA1C 5.7 12/10/2012       Assessment & Plan:

## 2013-03-03 NOTE — Patient Instructions (Signed)
Allergic Rhinitis  Allergic rhinitis is when the mucous membranes in the nose respond to allergens. Allergens are particles in the air that cause your body to have an allergic reaction. This causes you to release allergic antibodies. Through a chain of events, these eventually cause you to release histamine into the blood stream (hence the use of antihistamines). Although meant to be protective to the body, it is this release that causes your discomfort, such as frequent sneezing, congestion and an itchy runny nose.    CAUSES    The pollen allergens may come from grasses, trees, and weeds. This is seasonal allergic rhinitis, or "hay fever." Other allergens cause year-round allergic rhinitis (perennial allergic rhinitis) such as house dust mite allergen, pet dander and mold spores.    SYMPTOMS     Nasal stuffiness (congestion).   Runny, itchy nose with sneezing and tearing of the eyes.   There is often an itching of the mouth, eyes and ears.  It cannot be cured, but it can be controlled with medications.  DIAGNOSIS    If you are unable to determine the offending allergen, skin or blood testing may find it.  TREATMENT     Avoid the allergen.   Medications and allergy shots (immunotherapy) can help.   Hay fever may often be treated with antihistamines in pill or nasal spray forms. Antihistamines block the effects of histamine. There are over-the-counter medicines that may help with nasal congestion and swelling around the eyes. Check with your caregiver before taking or giving this medicine.  If the treatment above does not work, there are many new medications your caregiver can prescribe. Stronger medications may be used if initial measures are ineffective. Desensitizing injections can be used if medications and avoidance fails. Desensitization is when a patient is given ongoing shots until the body becomes less sensitive to the allergen. Make sure you follow up with your caregiver if problems continue.   SEEK MEDICAL CARE IF:     You develop fever (more than 100.5 F (38.1 C).   You develop a cough that does not stop easily (persistent).   You have shortness of breath.   You start wheezing.   Symptoms interfere with normal daily activities.  Document Released: 07/10/2001 Document Revised: 01/07/2012 Document Reviewed: 01/19/2009  [employer] Patient Information 2013 [employer].

## 2013-03-03 NOTE — Assessment & Plan Note (Signed)
Last CBC was normal. 

## 2013-03-03 NOTE — Assessment & Plan Note (Signed)
Repeat CT scan will be due in Sept 2014

## 2013-03-03 NOTE — Assessment & Plan Note (Signed)
Continue the current meds

## 2013-05-15 ENCOUNTER — Other Ambulatory Visit: Payer: Self-pay | Admitting: Otolaryngology

## 2013-05-15 DIAGNOSIS — H905 Unspecified sensorineural hearing loss: Secondary | ICD-10-CM

## 2013-05-19 ENCOUNTER — Ambulatory Visit
Admission: RE | Admit: 2013-05-19 | Discharge: 2013-05-19 | Disposition: A | Discharge status: Home / Self Care | Payer: Medicare Other | Source: Ambulatory Visit | Attending: Otolaryngology | Admitting: Otolaryngology

## 2013-05-19 DIAGNOSIS — H905 Unspecified sensorineural hearing loss: Secondary | ICD-10-CM

## 2013-05-26 ENCOUNTER — Other Ambulatory Visit: Payer: Medicare Other

## 2013-06-02 ENCOUNTER — Telehealth: Payer: Self-pay | Admitting: Internal Medicine

## 2013-06-02 NOTE — Telephone Encounter (Signed)
Pt called request phone call back from [facility] abut the bill that pt have been dealing with between our billing department and her insurance company. Pt stated that she received bill from from [facility] date 06/23/12 when she saw Dr. Debby Bud and zostavax was given to pt at the office visit. Her insurance company is not going to pay for this because it is a late filling. Please call pt.

## 2013-06-16 ENCOUNTER — Encounter: Payer: Self-pay | Admitting: Internal Medicine

## 2013-06-16 ENCOUNTER — Other Ambulatory Visit (INDEPENDENT_AMBULATORY_CARE_PROVIDER_SITE_OTHER): Payer: Medicare Other

## 2013-06-16 ENCOUNTER — Ambulatory Visit (INDEPENDENT_AMBULATORY_CARE_PROVIDER_SITE_OTHER): Payer: Medicare Other | Admitting: Internal Medicine

## 2013-06-16 VITALS — BP 134/80 | HR 86 | Temp 97.8°F | Resp 16 | Wt 126.0 lb

## 2013-06-16 DIAGNOSIS — D473 Essential (hemorrhagic) thrombocythemia: Secondary | ICD-10-CM

## 2013-06-16 DIAGNOSIS — R51 Headache: Secondary | ICD-10-CM

## 2013-06-16 DIAGNOSIS — E785 Hyperlipidemia, unspecified: Secondary | ICD-10-CM

## 2013-06-16 DIAGNOSIS — R519 Headache, unspecified: Secondary | ICD-10-CM | POA: Insufficient documentation

## 2013-06-16 LAB — COMPREHENSIVE METABOLIC PANEL
ALT: 12 U/L (ref 0–35)
AST: 12 U/L (ref 0–37)
CO2: 29 mEq/L (ref 19–32)
Calcium: 9.3 mg/dL (ref 8.4–10.5)
Chloride: 101 mEq/L (ref 96–112)
GFR: 82.17 mL/min (ref >60.00)
Potassium: 4.7 mEq/L (ref 3.5–5.1)
Sodium: 134 mEq/L — ABNORMAL LOW (ref 135–145)
Total Protein: 7.8 g/dL (ref 6.0–8.3)

## 2013-06-16 LAB — LIPID PANEL
Cholesterol: 140 mg/dL (ref 0–200)
HDL: 47.5 mg/dL (ref >39.00)
Total CHOL/HDL Ratio: 3
Triglycerides: 52 mg/dL (ref 0.0–149.0)

## 2013-06-16 LAB — CBC WITH DIFFERENTIAL/PLATELET
Basophils Absolute: 0.1 10*3/uL (ref 0.0–0.1)
Eosinophils Absolute: 0.1 10*3/uL (ref 0.0–0.7)
Lymphocytes Relative: 27.5 % (ref 12.0–46.0)
Lymphs Abs: 1.8 10*3/uL (ref 0.7–4.0)
MCHC: 34.1 g/dL (ref 30.0–36.0)
Monocytes Relative: 5 % (ref 3.0–12.0)
Neutro Abs: 4.3 10*3/uL (ref 1.4–7.7)
Platelets: 400 10*3/uL (ref 150.0–400.0)
RDW: 13.9 % (ref 11.5–14.6)

## 2013-06-16 LAB — C-REACTIVE PROTEIN: CRP: 0.9 mg/dL (ref 0.5–20.0)

## 2013-06-16 LAB — TSH: TSH: 1.65 u[IU]/mL (ref 0.35–5.50)

## 2013-06-16 NOTE — Patient Instructions (Signed)
General Headache Without Cause A headache is pain or discomfort felt around the head or neck area. The specific cause of a headache may not be found. There are many causes and types of headaches. A few common ones are:  Tension headaches.  Migraine headaches.  Cluster headaches.  Chronic daily headaches. HOME CARE INSTRUCTIONS   Keep all follow-up appointments with your caregiver or any specialist referral.  Only take over-the-counter or prescription medicines for pain or discomfort as directed by your caregiver.  Lie down in a dark, quiet room when you have a headache.  Keep a headache journal to find out what may trigger your migraine headaches. For example, write down:  What you eat and drink.  How much sleep you get.  Any change to your diet or medicines.  Try massage or other relaxation techniques.  Put ice packs or heat on the head and neck. Use these 3 to 4 times per day for 15 to 20 minutes each time, or as needed.  Limit stress.  Sit up straight, and do not tense your muscles.  Quit smoking if you smoke.  Limit alcohol use.  Decrease the amount of caffeine you drink, or stop drinking caffeine.  Eat and sleep on a regular schedule.  Get 7 to 9 hours of sleep, or as recommended by your caregiver.  Keep lights dim if bright lights bother you and make your headaches worse. SEEK MEDICAL CARE IF:   You have problems with the medicines you were prescribed.  Your medicines are not working.  You have a change from the usual headache.  You have nausea or vomiting. SEEK IMMEDIATE MEDICAL CARE IF:   Your headache becomes severe.  You have a fever.  You have a stiff neck.  You have loss of vision.  You have muscular weakness or loss of muscle control.  You start losing your balance or have trouble walking.  You feel faint or pass out.  You have severe symptoms that are different from your first symptoms. MAKE SURE YOU:   Understand these  instructions.  Will watch your condition.  Will get help right away if you are not doing well or get worse. Document Released: 10/15/2005 Document Revised: 01/07/2012 Document Reviewed: 10/31/2011 [employer] Patient Information 2014 [employer].  

## 2013-06-17 ENCOUNTER — Encounter: Payer: Self-pay | Admitting: Internal Medicine

## 2013-06-17 NOTE — Assessment & Plan Note (Signed)
PLT count is normal today

## 2013-06-17 NOTE — Assessment & Plan Note (Signed)
ESR and CRP are normal so TA and vasculitis are excluded The other labs are normal as well so metabolic and infectious is ruled out She only takes APAP once a day so I don't think she has medication overuse/rebound headaches I have ordered an MRI to see if he has a mass, cva, or hydrocephalus

## 2013-06-17 NOTE — Assessment & Plan Note (Signed)
FLP CMP TSH today 

## 2013-06-17 NOTE — Progress Notes (Signed)
Subjective:    Patient ID: [name], female    DOB: 1941-07-10, 72 y.o.   MRN: [id number]  Headache  This is a chronic problem. The current episode started more than 1 month ago. The problem occurs constantly. The problem has been unchanged. The pain is located in the right unilateral, parietal, temporal and retro-orbital region. The pain does not radiate. The pain quality is not similar to prior headaches. The quality of the pain is described as aching. The pain is at a severity of 2/10. The pain is mild. Associated symptoms include insomnia, a loss of balance and sinus pressure. Pertinent negatives include no abdominal pain, abnormal behavior, anorexia, back pain, blurred vision, coughing, dizziness, drainage, ear pain, eye pain, eye redness, eye watering, facial sweating, fever, hearing loss, muscle aches, nausea, neck pain, numbness, phonophobia, photophobia, rhinorrhea, scalp tenderness, seizures, sore throat, swollen glands, tingling, tinnitus, visual change, vomiting, weakness or weight loss. Nothing aggravates the symptoms. She has tried acetaminophen for the symptoms. The treatment provided moderate relief. Her past medical history is significant for sinus disease. There is no history of cancer, cluster headaches, hypertension, immunosuppression, migraine headaches, migraines in the family, obesity, pseudotumor cerebri, recent head traumas or TMJ.      Review of Systems  Constitutional: Negative.  Negative for fever, chills, weight loss, diaphoresis, activity change, appetite change, fatigue and unexpected weight change.  HENT: Positive for sinus pressure. Negative for hearing loss, ear pain, sore throat, facial swelling, rhinorrhea, trouble swallowing, neck pain, voice change, tinnitus and ear discharge.   Eyes: Negative.  Negative for blurred vision, photophobia, pain and redness.  Respiratory: Negative.  Negative for cough, choking, chest tightness, shortness of breath, wheezing and  stridor.   Cardiovascular: Negative.  Negative for chest pain, palpitations and leg swelling.  Gastrointestinal: Negative.  Negative for nausea, vomiting, abdominal pain, diarrhea, constipation and anorexia.  Endocrine: Negative.   Genitourinary: Negative.   Musculoskeletal: Negative.  Negative for myalgias, back pain, joint swelling and gait problem.  Skin: Negative.   Allergic/Immunologic: Negative.   Neurological: Positive for headaches and loss of balance. Negative for dizziness, tingling, tremors, seizures, syncope, facial asymmetry, speech difficulty, weakness, light-headedness and numbness.  Hematological: Negative.  Negative for adenopathy. Does not bruise/bleed easily.  Psychiatric/Behavioral: The patient has insomnia.        Objective:   Physical Exam  Vitals reviewed. Constitutional: She is oriented to person, place, and time. She appears well-developed and well-nourished. No distress.  HENT:  Head: Normocephalic and atraumatic.  Mouth/Throat: Oropharynx is clear and moist. No oropharyngeal exudate.  Eyes: Conjunctivae and EOM are normal. Pupils are equal, round, and reactive to light. Right eye exhibits no discharge. Left eye exhibits no discharge. No scleral icterus.  Neck: Normal range of motion. Neck supple. No JVD present. No tracheal deviation present. No thyromegaly present.  Cardiovascular: Normal rate, regular rhythm, normal heart sounds and intact distal pulses.  Exam reveals no gallop and no friction rub.   No murmur heard. Pulmonary/Chest: Effort normal and breath sounds normal. No stridor. No respiratory distress. She has no wheezes. She has no rales. She exhibits no tenderness.  Abdominal: Soft. Bowel sounds are normal. She exhibits no distension and no mass. There is no tenderness. There is no rebound and no guarding.  Musculoskeletal: Normal range of motion. She exhibits no edema and no tenderness.  Lymphadenopathy:    She has no cervical adenopathy.   Neurological: She is alert and oriented to person, place, and time. She has  normal strength. She displays no atrophy, no tremor and normal reflexes. No cranial nerve deficit or sensory deficit. She exhibits normal muscle tone. She displays a negative Romberg sign. She displays no seizure activity. Coordination and gait normal. She displays no Babinski's sign on the right side. She displays no Babinski's sign on the left side.  Reflex Scores:      Tricep reflexes are 1+ on the right side and 1+ on the left side.      Bicep reflexes are 1+ on the right side and 1+ on the left side.      Brachioradialis reflexes are 1+ on the right side and 1+ on the left side.      Patellar reflexes are 2+ on the right side and 2+ on the left side.      Achilles reflexes are 1+ on the right side and 1+ on the left side. Skin: Skin is warm and dry. No rash noted. She is not diaphoretic. No erythema. No pallor.  Psychiatric: She has a normal mood and affect. Her behavior is normal. Judgment and thought content normal.          Assessment & Plan:

## 2013-06-19 ENCOUNTER — Encounter: Payer: Self-pay | Admitting: Internal Medicine

## 2013-06-19 ENCOUNTER — Telehealth: Payer: Self-pay | Admitting: *Deleted

## 2013-06-19 ENCOUNTER — Ambulatory Visit
Admission: RE | Admit: 2013-06-19 | Discharge: 2013-06-19 | Disposition: A | Discharge status: Home / Self Care | Payer: Medicare Other | Source: Ambulatory Visit | Attending: Internal Medicine | Admitting: Internal Medicine

## 2013-06-19 DIAGNOSIS — R51 Headache: Secondary | ICD-10-CM

## 2013-06-19 NOTE — Telephone Encounter (Signed)
Called pt and advised her that Dr Yetta Barre has put in a referral to neurology for her. Pt pleased and understood.

## 2013-06-19 NOTE — Telephone Encounter (Signed)
Pt called and lvm requesting results from her MRI of head and neck. Call back [phone].

## 2013-06-19 NOTE — Telephone Encounter (Signed)
normal

## 2013-06-19 NOTE — Addendum Note (Signed)
Addended by: Etta Grandchild on: 06/19/2013 04:33 PM   Modules accepted: Orders

## 2013-06-19 NOTE — Telephone Encounter (Signed)
I will refer her to neurology.

## 2013-06-19 NOTE — Telephone Encounter (Signed)
Called pt and advised her that her MRI was normal. Pt states she is still having headaches and would like to know if Dr Yetta Barre wants to see her to discuss. Please advise.

## 2013-07-08 ENCOUNTER — Encounter: Payer: Self-pay | Admitting: Diagnostic Neuroimaging

## 2013-07-08 ENCOUNTER — Ambulatory Visit (INDEPENDENT_AMBULATORY_CARE_PROVIDER_SITE_OTHER): Payer: Medicare Other | Admitting: Diagnostic Neuroimaging

## 2013-07-08 VITALS — BP 152/89 | HR 90 | Temp 98.1°F | Ht 69.5 in | Wt 127.0 lb

## 2013-07-08 DIAGNOSIS — G43009 Migraine without aura, not intractable, without status migrainosus: Secondary | ICD-10-CM

## 2013-07-08 NOTE — Patient Instructions (Signed)
Migraine Headache A migraine headache is an intense, throbbing pain on one or both sides of your head. A migraine can last for 30 minutes to several hours. CAUSES  The exact cause of a migraine headache is not always known. However, a migraine may be caused when nerves in the brain become irritated and release chemicals that cause inflammation. This causes pain. SYMPTOMS  Pain on one or both sides of your head.  Pulsating or throbbing pain.  Severe pain that prevents daily activities.  Pain that is aggravated by any physical activity.  Nausea, vomiting, or both.  Dizziness.  Pain with exposure to bright lights, loud noises, or activity.  General sensitivity to bright lights, loud noises, or smells. Before you get a migraine, you may get warning signs that a migraine is coming (aura). An aura may include:  Seeing flashing lights.  Seeing bright spots, halos, or zig-zag lines.  Having tunnel vision or blurred vision.  Having feelings of numbness or tingling.  Having trouble talking.  Having muscle weakness. MIGRAINE TRIGGERS  Alcohol.  Smoking.  Stress.  Menstruation.  Aged cheeses.  Foods or drinks that contain nitrates, glutamate, aspartame, or tyramine.  Lack of sleep.  Chocolate.  Caffeine.  Hunger.  Physical exertion.  Fatigue.  Medicines used to treat chest pain (nitroglycerine), birth control pills, estrogen, and some blood pressure medicines. DIAGNOSIS  A migraine headache is often diagnosed based on:  Symptoms.  Physical examination.  A CT scan or MRI of your head. TREATMENT Medicines may be given for pain and nausea. Medicines can also be given to help prevent recurrent migraines.  HOME CARE INSTRUCTIONS  Only take over-the-counter or prescription medicines for pain or discomfort as directed by your caregiver. The use of long-term narcotics is not recommended.  Lie down in a dark, quiet room when you have a migraine.  Keep a journal  to find out what may trigger your migraine headaches. For example, write down:  What you eat and drink.  How much sleep you get.  Any change to your diet or medicines.  Limit alcohol consumption.  Quit smoking if you smoke.  Get 7 to 9 hours of sleep, or as recommended by your caregiver.  Limit stress.  Keep lights dim if bright lights bother you and make your migraines worse. SEEK IMMEDIATE MEDICAL CARE IF:   Your migraine becomes severe.  You have a fever.  You have a stiff neck.  You have vision loss.  You have muscular weakness or loss of muscle control.  You start losing your balance or have trouble walking.  You feel faint or pass out.  You have severe symptoms that are different from your first symptoms. MAKE SURE YOU:   Understand these instructions.  Will watch your condition.  Will get help right away if you are not doing well or get worse. Document Released: 10/15/2005 Document Revised: 01/07/2012 Document Reviewed: 10/05/2011 Exit[employer] Patient Information 2014 [employer].  

## 2013-07-08 NOTE — Progress Notes (Signed)
[facility]  PATIENT: [name] DOB: [birth year]  REFERRING CLINICIAN: Leitha Schuller HISTORY FROM: patient REASON FOR VISIT: new consult   HISTORICAL  CHIEF COMPLAINT:  Chief Complaint  Patient presents with  . NP chronic headaches    HISTORY OF PRESENT ILLNESS:   72 year old right-handed female with history of anxiety, smoking, here for evaluation of headaches since January 2014.  Patient reports new-onset right-sided headaches, right eye pain, right maxillary pain, right ear pain, throbbing and pulsating sensation with sharp shooting pains. She has photophobia and phonophobia. No nausea vomiting. No visual symptoms or aura. She is under increasing amounts of stress over the past year related to her own aging, caring for her mentally retarded son, her daughter and her family moving away to [place]. Patient reports increasing headaches in the afternoons around 4 PM. She takes Tylenol and this helps alleviate the headache.  No prior history of migraine headaches earlier. No family history of migraine headaches. She drinks coffee daily, but recently switched to decaffeinated. No change in headaches since that time.    REVIEW OF SYSTEMS: Full 14 system review of systems performed and notable only for headaches.  ALLERGIES: Allergies  Allergen Reactions  . Tetracycline     REACTION: Yeast infection    HOME MEDICATIONS: Prior to Admission medications   Medication Sig Start Date End Date Taking? Authorizing Provider  Cholecalciferol (VITAMIN D-3) 5000 UNITS TABS Take by mouth daily.   Yes Historical Provider, MD  clobetasol cream (TEMOVATE) 0.05 % Apply topically 2 (two) times daily.   Yes Historical Provider, MD  clorazepate (TRANXENE-T) 7.5 MG tablet TAKE 1/2 TABLET AT BEDTIME AND 1/2 TABLET AS NEEDED. 03/03/13  Yes Etta Grandchild, MD  estrogens, conjugated, (PREMARIN) 0.3 MG tablet TAKE 1 TABLET ONCE DAILY. TAKE DAILY FOR 21 DAYS THEN DONT TAKE FOR 7DAYS.  03/03/13  Yes Etta Grandchild, MD  fluticasone Aleda Grana) 50 MCG/ACT nasal spray  05/14/13  Yes Historical Provider, MD  Nutritional Supplements (BOOST PLUS PO) Take by mouth.   Yes Historical Provider, MD   Outpatient Prescriptions Prior to Visit  Medication Sig Dispense Refill  . clobetasol cream (TEMOVATE) 0.05 % Apply topically 2 (two) times daily.      . clorazepate (TRANXENE-T) 7.5 MG tablet TAKE 1/2 TABLET AT BEDTIME AND 1/2 TABLET AS NEEDED.  60 tablet  5  . estrogens, conjugated, (PREMARIN) 0.3 MG tablet TAKE 1 TABLET ONCE DAILY. TAKE DAILY FOR 21 DAYS THEN DONT TAKE FOR 7DAYS.  30 tablet  11  . fluticasone (FLONASE) 50 MCG/ACT nasal spray       . Nutritional Supplements (BOOST PLUS PO) Take by mouth.      Marland Kitchen aspirin 81 MG EC tablet Take 1 tablet (81 mg total) by mouth daily. Swallow whole.  90 tablet  3  . fexofenadine (ALLEGRA) 180 MG tablet Take 180 mg by mouth daily as needed.       . Multiple Vitamins-Minerals (WOMENS 50+ MULTI VITAMIN/MIN PO) Take 1 tablet by mouth daily.         No facility-administered medications prior to visit.    PAST MEDICAL HISTORY: Past Medical History  Diagnosis Date  . Heart murmur     childhood  . Ruptured eardrum     childhood  . Hemorrhoids, internal   . Hx: UTI (urinary tract infection)   . Necrobiosis lipoidica   . GERD (gastroesophageal reflux disease)   . Osteoarthritis   . Osteoporosis   . Anxiety   .  Anxiety state, unspecified 03/02/2008  . ECZEMA, HX OF 07/25/2007    psoriasis  . GERD 07/25/2007  . Irritable bowel syndrome 08/18/2009  . OSTEOARTHRITIS 07/25/2007  . OSTEOPOROSIS 07/25/2007  . Palpitations 05/27/2009  . SYNCOPE 04/21/2009    PAST SURGICAL HISTORY: Past Surgical History  Procedure Laterality Date  . Vesicovaginal fistula closure w/ tah      with BSO-'82  . Bunionectomy      with hammertoe correction - '93  . Abdominal hysterectomy      FAMILY HISTORY: Family History  Problem Relation Age of Onset  . Arthritis  Mother   . Heart disease Mother   . Hypertension Mother   . Heart attack Paternal Grandfather   . Coronary artery disease Other     paternal kinship  . Diabetes Other     Paternal kinship  . Cancer Neg Hx     colon  . Diabetes Brother   . Heart disease Brother     PAD  . Aneurysm Brother   . Aneurysm Father     SOCIAL HISTORY:  History   Social History  . Marital Status: Divorced    Spouse Name: N/A    Number of Children: 2  . Years of Education: college   Occupational History  . opthal tech    Social History Main Topics  . Smoking status: Current Every Day Smoker -- 1.00 packs/day for 50 years    Types: Cigarettes  . Smokeless tobacco: Never Used  . Alcohol Use: No     Comment: occasional glass of wine  . Drug Use: No  . Sexual Activity: No   Other Topics Concern  . Not on file   Social History Narrative   HS[employer], [employer] 2 year associates degree. Married  '61 -Divorced after 17 yrs marriage. 1 dtr - '66; 1 son '68,2 grandchildren. Work - Advice worker - retired. ACP - CPR yes, yes for short term mechanical ventilation. No prolonged heroic measures. Provided Packet.           PHYSICAL EXAM  Filed Vitals:   07/08/13 0947  BP: 152/89  Pulse: 90  Temp: 98.1 F (36.7 C)  TempSrc: Oral  Height: 5' 9.5" (1.765 m)  Weight: 127 lb (57.607 kg)    Not recorded    Body mass index is 18.49 kg/(m^2).  GENERAL EXAM: Patient is in no distress; SMELLS OF CIG SMOKE.  CARDIOVASCULAR: Regular rate and rhythm, no murmurs, no carotid bruits  NEUROLOGIC: MENTAL STATUS: awake, alert, language fluent, comprehension intact, naming intact CRANIAL NERVE: no papilledema on fundoscopic exam, pupils equal and reactive to light, visual fields full to confrontation, extraocular muscles intact, no nystagmus, facial sensation and strength symmetric, uvula midline, shoulder shrug symmetric, tongue midline. MOTOR: normal bulk and tone, full strength in the  BUE, BLE SENSORY: normal and symmetric to light touch, pinprick, temperature, vibration COORDINATION: finger-nose-finger, fine finger movements normal REFLEXES: deep tendon reflexes present and symmetric GAIT/STATION: narrow based gait; able to walk on toes, heels and tandem; romberg is negative   DIAGNOSTIC DATA (LABS, IMAGING, TESTING) - I reviewed patient records, labs, notes, testing and imaging myself where available.  Lab Results  Component Value Date   WBC 6.6 06/16/2013   HGB 14.9 06/16/2013   HCT 43.7 06/16/2013   MCV 87.1 06/16/2013   PLT 400.0 06/16/2013      Component Value Date/Time   NA 134* 06/16/2013 0840   K 4.7 06/16/2013 0840   CL 101 06/16/2013 0840  CO2 29 06/16/2013 0840   GLUCOSE 100* 06/16/2013 0840   BUN 11 06/16/2013 0840   CREATININE 0.7 06/16/2013 0840   CALCIUM 9.3 06/16/2013 0840   PROT 7.8 06/16/2013 0840   ALBUMIN 3.9 06/16/2013 0840   AST 12 06/16/2013 0840   ALT 12 06/16/2013 0840   ALKPHOS 54 06/16/2013 0840   BILITOT 0.4 06/16/2013 0840   GFRNONAA 89.93 03/20/2010 0826   GFRAA  Value: >60        The eGFR has been calculated using the MDRD equation. This calculation has not been validated in all clinical situations. eGFR's persistently <60 mL/min signify possible Chronic Kidney Disease. 04/22/2009 2206   Lab Results  Component Value Date   CHOL 140 06/16/2013   HDL 47.50 06/16/2013   LDLCALC 82 06/16/2013   TRIG 52.0 06/16/2013   CHOLHDL 3 06/16/2013   Lab Results  Component Value Date   HGBA1C 5.7 12/10/2012   Lab Results  Component Value Date   VITAMINB12 270 05/08/2011   Lab Results  Component Value Date   TSH 1.65 06/16/2013     06/19/13 MRI brain - No acute intracranial abnormality. Age advanced white matter disease as described. This likely  reflects the sequelae of chronic microvascular ischemia.    ASSESSMENT AND PLAN  72 y.o. year old female here with new onset headaches in January 2014. These have migraine features. Also under  significant psychosocial stress. Headaches are under fair control with Tylenol. I offered migraine prophylactic medications (such as amitriptyline, Topamax, Depakote) the patient would like to hold off. She wants to try to manage her stress, in conjunction with her psychiatrist/psychologist, and hopefully this will improve her headaches.  Dx: migraine without aura  PLAN: -Return if symptoms worsen or fail to improve, for with Edison Nasuti, MD 07/08/2013, 11:12 AM Certified in Neurology, Neurophysiology and Neuroimaging  [facility] [address] [place], [place] [address] [phone]

## 2013-09-01 ENCOUNTER — Ambulatory Visit (INDEPENDENT_AMBULATORY_CARE_PROVIDER_SITE_OTHER): Payer: Medicare Other | Admitting: Internal Medicine

## 2013-09-01 ENCOUNTER — Encounter: Payer: Self-pay | Admitting: Internal Medicine

## 2013-09-01 VITALS — BP 130/74 | HR 98 | Temp 98.7°F | Resp 16 | Ht 69.5 in | Wt 128.2 lb

## 2013-09-01 DIAGNOSIS — G501 Atypical facial pain: Secondary | ICD-10-CM

## 2013-09-01 MED ORDER — PREGABALIN 50 MG PO CAPS: 50.0000 mg | ORAL_CAPSULE | Freq: Three times a day (TID) | ORAL | Status: DC

## 2013-09-01 NOTE — Progress Notes (Signed)
Pre-visit discussion using our clinic review tool. No additional management support is needed unless otherwise documented below in the visit note.  

## 2013-09-01 NOTE — Progress Notes (Signed)
Subjective:    Patient ID: [name], female    DOB: [birth year], 72 y.o.   MRN: [id number]  HPI  She returns c/o recurrent episodes of right facial pain - around her right eye and right malar area. She was seen by neurology and at that time she complained of a headache and was diagnosed with migraine but she decided not to try any of the meds offered by neurology. But now, she tells me that there has not been a headache but much more facial pain.  Review of Systems  Constitutional: Negative.  Negative for fever, chills, diaphoresis, activity change, appetite change, fatigue and unexpected weight change.  HENT: Negative.  Negative for facial swelling, hearing loss, sinus pressure, sore throat, trouble swallowing and voice change.   Eyes: Negative.   Respiratory: Negative.  Negative for cough, chest tightness, shortness of breath, wheezing and stridor.   Cardiovascular: Negative.  Negative for chest pain, palpitations and leg swelling.  Gastrointestinal: Negative.  Negative for nausea, vomiting, abdominal pain, diarrhea, constipation and blood in stool.  Endocrine: Negative.   Genitourinary: Negative.   Musculoskeletal: Negative.   Skin: Negative.   Allergic/Immunologic: Negative.   Neurological: Negative.  Negative for dizziness, tremors, syncope, facial asymmetry, speech difficulty, weakness, light-headedness, numbness and headaches.  Hematological: Negative.  Negative for adenopathy. Does not bruise/bleed easily.  Psychiatric/Behavioral: Positive for sleep disturbance. Negative for suicidal ideas, hallucinations, behavioral problems, confusion, self-injury, dysphoric mood, decreased concentration and agitation. The patient is nervous/anxious. The patient is not hyperactive.        Objective:   Physical Exam  Vitals reviewed. Constitutional: She is oriented to person, place, and time. She appears well-developed and well-nourished. No distress.  HENT:  Head: Normocephalic and  atraumatic.  Mouth/Throat: Oropharynx is clear and moist. No oropharyngeal exudate.  Eyes: Conjunctivae and EOM are normal. Pupils are equal, round, and reactive to light. Right eye exhibits no discharge. Left eye exhibits no discharge. No scleral icterus.  Neck: Normal range of motion. Neck supple. No JVD present. No tracheal deviation present. No thyromegaly present.  Cardiovascular: Normal rate, regular rhythm, normal heart sounds and intact distal pulses.  Exam reveals no gallop and no friction rub.   No murmur heard. Pulmonary/Chest: Effort normal and breath sounds normal. No stridor. No respiratory distress. She has no wheezes. She has no rales. She exhibits no tenderness.  Abdominal: Soft. Bowel sounds are normal. She exhibits no distension and no mass. There is no tenderness. There is no rebound and no guarding.  Musculoskeletal: Normal range of motion. She exhibits no edema and no tenderness.  Lymphadenopathy:    She has no cervical adenopathy.  Neurological: She is alert and oriented to person, place, and time. She has normal reflexes. She displays normal reflexes. No cranial nerve deficit. She exhibits normal muscle tone. Coordination normal.  Skin: Skin is warm and dry. No rash noted. She is not diaphoretic. No erythema. No pallor.     Lab Results  Component Value Date   WBC 6.6 06/16/2013   HGB 14.9 06/16/2013   HCT 43.7 06/16/2013   PLT 400.0 06/16/2013   GLUCOSE 100* 06/16/2013   CHOL 140 06/16/2013   TRIG 52.0 06/16/2013   HDL 47.50 06/16/2013   LDLCALC 82 06/16/2013   ALT 12 06/16/2013   AST 12 06/16/2013   NA 134* 06/16/2013   K 4.7 06/16/2013   CL 101 06/16/2013   CREATININE 0.7 06/16/2013   BUN 11 06/16/2013   CO2 29 06/16/2013  TSH 1.65 06/16/2013   INR 1.0 04/22/2009   HGBA1C 5.7 12/10/2012       Assessment & Plan:

## 2013-09-01 NOTE — Patient Instructions (Signed)
Trigeminal Neuralgia Trigeminal neuralgia is a nerve disorder that causes sudden attacks of severe facial pain. It is caused by damage to the trigeminal nerve, a major nerve in the face. It is more common in women and in the elderly, although it can also happen in younger patients. Attacks last from a few seconds to several minutes and can occur from a couple of times per year to several times per day. Trigeminal neuralgia can be a very distressing and disabling condition. Surgery may be needed in very severe cases if medical treatment does not give relief. HOME CARE INSTRUCTIONS   If your caregiver prescribed medication to help prevent attacks, take as directed.  To help prevent attacks:  Chew on the unaffected side of the mouth.  Avoid touching your face.  Avoid blasts of hot or cold air.  Men may wish to grow a beard to avoid having to shave. SEEK IMMEDIATE MEDICAL CARE IF:  Pain is unbearable and your medicine does not help.  You develop new, unexplained symptoms (problems).  You have problems that may be related to a medication you are taking. Document Released: 10/12/2000 Document Revised: 01/07/2012 Document Reviewed: 08/12/2009 [employer] Patient Information 2014 [employer].  

## 2013-09-02 NOTE — Assessment & Plan Note (Signed)
I am concerned that she has trigeminal neuralgia so I have asked her to try lyrica for the pain

## 2013-10-06 ENCOUNTER — Telehealth: Payer: Self-pay

## 2013-10-06 DIAGNOSIS — F411 Generalized anxiety disorder: Secondary | ICD-10-CM

## 2013-10-06 DIAGNOSIS — K589 Irritable bowel syndrome without diarrhea: Secondary | ICD-10-CM

## 2013-10-06 NOTE — Telephone Encounter (Signed)
Please advise if ok to refill clorazepate 7.5mg  1/2qhs and 1/2 prn #60. Rx last filled 03/03/13 #60/5rf adn pt last seen 09/01/13.

## 2013-10-06 NOTE — Telephone Encounter (Signed)
OK 

## 2013-10-07 MED ORDER — CLORAZEPATE DIPOTASSIUM 7.5 MG PO TABS: ORAL_TABLET | ORAL | Status: DC

## 2013-12-08 ENCOUNTER — Encounter: Payer: Self-pay | Admitting: Internal Medicine

## 2013-12-08 ENCOUNTER — Ambulatory Visit (INDEPENDENT_AMBULATORY_CARE_PROVIDER_SITE_OTHER): Payer: Medicare Other | Admitting: Internal Medicine

## 2013-12-08 ENCOUNTER — Ambulatory Visit (INDEPENDENT_AMBULATORY_CARE_PROVIDER_SITE_OTHER)
Admission: RE | Admit: 2013-12-08 | Discharge: 2013-12-08 | Disposition: A | Discharge status: Home / Self Care | Payer: Medicare Other | Source: Ambulatory Visit | Attending: Internal Medicine | Admitting: Internal Medicine

## 2013-12-08 VITALS — BP 120/80 | HR 96 | Temp 97.1°F | Resp 16 | Ht 69.5 in

## 2013-12-08 DIAGNOSIS — J449 Chronic obstructive pulmonary disease, unspecified: Secondary | ICD-10-CM

## 2013-12-08 DIAGNOSIS — R918 Other nonspecific abnormal finding of lung field: Secondary | ICD-10-CM

## 2013-12-08 DIAGNOSIS — F17200 Nicotine dependence, unspecified, uncomplicated: Secondary | ICD-10-CM

## 2013-12-08 DIAGNOSIS — G501 Atypical facial pain: Secondary | ICD-10-CM

## 2013-12-08 DIAGNOSIS — Z720 Tobacco use: Secondary | ICD-10-CM

## 2013-12-08 NOTE — Progress Notes (Signed)
   Subjective:    Patient ID: [name], female    DOB: 08-Dec-1940, 73 y.o.   MRN: [id number]  HPI Comments: She returns for a COPD f/up and for a repeat CXR. She feels well and offers no complaints.     Review of Systems  Constitutional: Negative.  Negative for fever, chills, diaphoresis, appetite change and fatigue.  HENT: Negative.   Eyes: Negative.   Respiratory: Negative.  Negative for cough, choking, chest tightness, shortness of breath, wheezing and stridor.   Cardiovascular: Negative.  Negative for chest pain, palpitations and leg swelling.  Gastrointestinal: Negative.  Negative for nausea, vomiting, abdominal pain, diarrhea, constipation and blood in stool.  Endocrine: Negative.   Genitourinary: Negative.   Musculoskeletal: Negative.   Skin: Negative.   Allergic/Immunologic: Negative.   Neurological: Negative.   Hematological: Negative.  Negative for adenopathy. Does not bruise/bleed easily.  Psychiatric/Behavioral: Negative.        Objective:   Physical Exam  Vitals reviewed. Constitutional: She is oriented to person, place, and time. She appears well-developed and well-nourished.  Non-toxic appearance. She does not have a sickly appearance. She does not appear ill. No distress.  HENT:  Head: Normocephalic and atraumatic.  Mouth/Throat: Oropharynx is clear and moist. No oropharyngeal exudate.  Eyes: Conjunctivae are normal. Right eye exhibits no discharge. Left eye exhibits no discharge. No scleral icterus.  Neck: Normal range of motion. Neck supple. No JVD present. No tracheal deviation present. No thyromegaly present.  Cardiovascular: Normal rate, regular rhythm, normal heart sounds and intact distal pulses.  Exam reveals no gallop and no friction rub.   No murmur heard. Pulmonary/Chest: Effort normal and breath sounds normal. No stridor. No respiratory distress. She has no wheezes. She has no rales. She exhibits no tenderness.  Abdominal: Soft. Bowel sounds  are normal. She exhibits no distension and no mass. There is no tenderness. There is no rebound and no guarding.  Musculoskeletal: Normal range of motion. She exhibits no edema and no tenderness.  Lymphadenopathy:    She has no cervical adenopathy.  Neurological: She is oriented to person, place, and time.  Skin: Skin is warm and dry. No rash noted. She is not diaphoretic. No erythema. No pallor.  Psychiatric: She has a normal mood and affect. Her behavior is normal. Judgment and thought content normal.     Lab Results  Component Value Date   WBC 6.6 06/16/2013   HGB 14.9 06/16/2013   HCT 43.7 06/16/2013   PLT 400.0 06/16/2013   GLUCOSE 100* 06/16/2013   CHOL 140 06/16/2013   TRIG 52.0 06/16/2013   HDL 47.50 06/16/2013   LDLCALC 82 06/16/2013   ALT 12 06/16/2013   AST 12 06/16/2013   NA 134* 06/16/2013   K 4.7 06/16/2013   CL 101 06/16/2013   CREATININE 0.7 06/16/2013   BUN 11 06/16/2013   CO2 29 06/16/2013   TSH 1.65 06/16/2013   INR 1.0 04/22/2009   HGBA1C 5.7 12/10/2012       Assessment & Plan:

## 2013-12-08 NOTE — Patient Instructions (Signed)
Chronic Obstructive Pulmonary Disease  Chronic obstructive pulmonary disease (COPD) is a common lung condition in which airflow from the lungs is limited. COPD is a general term that can be used to describe many different lung problems that limit airflow, including both chronic bronchitis and emphysema.  If you have COPD, your lung function will probably never return to normal, but there are measures you can take to improve lung function and make yourself feel better.   CAUSES   · Smoking (common).    · Exposure to secondhand smoke.    · Genetic problems.  · Chronic inflammatory lung diseases or recurrent infections.  SYMPTOMS   · Shortness of breath, especially with physical activity.    · Deep, persistent (chronic) cough with a large amount of thick mucus.    · Wheezing.    · Rapid breaths (tachypnea).    · Gray or bluish discoloration (cyanosis) of the skin, especially in fingers, toes, or lips.    · Fatigue.    · Weight loss.    · Frequent infections or episodes when breathing symptoms become much worse (exacerbations).    · Chest tightness.  DIAGNOSIS   Your healthcare provider will take a medical history and perform a physical examination to make the initial diagnosis.  Additional tests for COPD may include:   · Lung (pulmonary) function tests.  · Chest X-ray.  · CT scan.  · Blood tests.  TREATMENT   Treatment available to help you feel better when you have COPD include:   · Inhaler and nebulizer medicines. These help manage the symptoms of COPD and make your breathing more comfortable  · Supplemental oxygen. Supplemental oxygen is only helpful if you have a low oxygen level in your blood.    · Exercise and physical activity. These are beneficial for nearly all people with COPD. Some people may also benefit from a pulmonary rehabilitation program.  HOME CARE INSTRUCTIONS   · Take all medicines (inhaled or pills) as directed by your health care provider.  · Only take over-the-counter or prescription medicines  for pain, fever, or discomfort as directed by your health care provider.    · Avoid over-the-counter medicines or cough syrups that dry up your airway (such as antihistamines) and slow down the elimination of secretions unless instructed otherwise by your healthcare provider.    · If you are a smoker, the most important thing that you can do is stop smoking. Continuing to smoke will cause further lung damage and breathing trouble. Ask your health care provider for help with quitting smoking. He or she can direct you to community resources or hospitals that provide support.  · Avoid exposure to irritants such as smoke, chemicals, and fumes that aggravate your breathing.  · Use oxygen therapy and pulmonary rehabilitation if directed by your health care provider. If you require home oxygen therapy, ask your healthcare provider whether you should purchase a pulse oximeter to measure your oxygen level at home.    · Avoid contact with individuals who have a contagious illness.  · Avoid extreme temperature and humidity changes.  · Eat healthy foods. Eating smaller, more frequent meals and resting before meals may help you maintain your strength.  · Stay active, but balance activity with periods of rest. Exercise and physical activity will help you maintain your ability to do things you want to do.  · Preventing infection and hospitalization is very important when you have COPD. Make sure to receive all the vaccines your health care provider recommends, especially the pneumococcal and influenza vaccines. Ask your healthcare provider whether you   need a pneumonia vaccine.  · Learn and use relaxation techniques to manage stress.  · Learn and use controlled breathing techniques as directed by your health care provider. Controlled breathing techniques include:    · Pursed lip breathing. Start by breathing in (inhaling) through your nose for 1 second. Then, purse your lips as if you were going to whistle and breathe out (exhale)  through the pursed lips for 2 seconds.    · Diaphragmatic breathing. Start by putting one hand on your abdomen just above your waist. Inhale slowly through your nose. The hand on your abdomen should move out. Then purse your lips and exhale slowly. You should be able to feel the hand on your abdomen moving in as you exhale.    · Learn and use controlled coughing to clear mucus from your lungs. Controlled coughing is a series of short, progressive coughs. The steps of controlled coughing are:    1. Lean your head slightly forward.    2. Breathe in deeply using diaphragmatic breathing.    3. Try to hold your breath for 3 seconds.    4. Keep your mouth slightly open while coughing twice.    5. Spit any mucus out into a tissue.    6. Rest and repeat the steps once or twice as needed.  SEEK MEDICAL CARE IF:   · You are coughing up more mucus than usual.    · There is a change in the color or thickness of your mucus.    · Your breathing is more labored than usual.    · Your breathing is faster than usual.    SEEK IMMEDIATE MEDICAL CARE IF:   · You have shortness of breath while you are resting.    · You have shortness of breath that prevents you from:  · Being able to talk.    · Performing your usual physical activities.    · You have chest pain lasting longer than 5 minutes.    · Your skin color is more cyanotic than usual.  · You measure low oxygen saturations for longer than 5 minutes with a pulse oximeter.  MAKE SURE YOU:   · Understand these instructions.  · Will watch your condition.  · Will get help right away if you are not doing well or get worse.  Document Released: 07/25/2005 Document Revised: 08/05/2013 Document Reviewed: 06/11/2013  [employer]® Patient Information ©2014 [employer].

## 2013-12-08 NOTE — Progress Notes (Signed)
Pre visit review using our clinic review tool, if applicable. No additional management support is needed unless otherwise documented below in the visit note. 

## 2013-12-10 ENCOUNTER — Encounter: Payer: Self-pay | Admitting: Internal Medicine

## 2013-12-10 NOTE — Assessment & Plan Note (Signed)
This has resolved.

## 2013-12-10 NOTE — Assessment & Plan Note (Signed)
This is well controlled without any treatment at this time

## 2013-12-10 NOTE — Assessment & Plan Note (Signed)
noted 

## 2014-04-01 ENCOUNTER — Other Ambulatory Visit: Payer: Self-pay | Admitting: Internal Medicine

## 2014-04-14 ENCOUNTER — Other Ambulatory Visit (INDEPENDENT_AMBULATORY_CARE_PROVIDER_SITE_OTHER): Payer: Medicare Other

## 2014-04-14 ENCOUNTER — Encounter: Payer: Self-pay | Admitting: Internal Medicine

## 2014-04-14 ENCOUNTER — Ambulatory Visit (INDEPENDENT_AMBULATORY_CARE_PROVIDER_SITE_OTHER): Payer: Medicare Other | Admitting: Internal Medicine

## 2014-04-14 VITALS — BP 116/72 | HR 100 | Temp 98.1°F | Resp 16 | Ht 69.5 in | Wt 125.8 lb

## 2014-04-14 DIAGNOSIS — Z23 Encounter for immunization: Secondary | ICD-10-CM

## 2014-04-14 DIAGNOSIS — Z1231 Encounter for screening mammogram for malignant neoplasm of breast: Secondary | ICD-10-CM

## 2014-04-14 DIAGNOSIS — L409 Psoriasis, unspecified: Secondary | ICD-10-CM

## 2014-04-14 DIAGNOSIS — D473 Essential (hemorrhagic) thrombocythemia: Secondary | ICD-10-CM

## 2014-04-14 DIAGNOSIS — E785 Hyperlipidemia, unspecified: Secondary | ICD-10-CM

## 2014-04-14 DIAGNOSIS — Z0000 Encounter for general adult medical examination without abnormal findings: Secondary | ICD-10-CM

## 2014-04-14 DIAGNOSIS — M1990 Unspecified osteoarthritis, unspecified site: Secondary | ICD-10-CM

## 2014-04-14 DIAGNOSIS — R7309 Other abnormal glucose: Secondary | ICD-10-CM

## 2014-04-14 DIAGNOSIS — K219 Gastro-esophageal reflux disease without esophagitis: Secondary | ICD-10-CM

## 2014-04-14 DIAGNOSIS — M810 Age-related osteoporosis without current pathological fracture: Secondary | ICD-10-CM

## 2014-04-14 DIAGNOSIS — L408 Other psoriasis: Secondary | ICD-10-CM

## 2014-04-14 DIAGNOSIS — F411 Generalized anxiety disorder: Secondary | ICD-10-CM

## 2014-04-14 DIAGNOSIS — K589 Irritable bowel syndrome without diarrhea: Secondary | ICD-10-CM

## 2014-04-14 LAB — COMPREHENSIVE METABOLIC PANEL
ALT: 8 U/L (ref 0–35)
AST: 15 U/L (ref 0–37)
Albumin: 3.9 g/dL (ref 3.5–5.2)
Alkaline Phosphatase: 55 U/L (ref 39–117)
BUN: 8 mg/dL (ref 6–23)
CHLORIDE: 100 meq/L (ref 96–112)
CO2: 27 meq/L (ref 19–32)
CREATININE: 0.9 mg/dL (ref 0.4–1.2)
Calcium: 9.1 mg/dL (ref 8.4–10.5)
GFR: 68.93 mL/min (ref >60.00)
Glucose, Bld: 108 mg/dL — ABNORMAL HIGH (ref 70–99)
Potassium: 4.1 mEq/L (ref 3.5–5.1)
Sodium: 135 mEq/L (ref 135–145)
Total Bilirubin: 0.3 mg/dL (ref 0.2–1.2)
Total Protein: 7.6 g/dL (ref 6.0–8.3)

## 2014-04-14 LAB — CBC WITH DIFFERENTIAL/PLATELET
Basophils Absolute: 0.1 10*3/uL (ref 0.0–0.1)
Basophils Relative: 1.2 % (ref 0.0–3.0)
EOS ABS: 0 10*3/uL (ref 0.0–0.7)
Eosinophils Relative: 0.4 % (ref 0.0–5.0)
HCT: 41.2 % (ref 36.0–46.0)
Hemoglobin: 13.6 g/dL (ref 12.0–15.0)
Lymphocytes Relative: 28.9 % (ref 12.0–46.0)
Lymphs Abs: 1.5 10*3/uL (ref 0.7–4.0)
MCHC: 33.1 g/dL (ref 30.0–36.0)
MCV: 87.4 fl (ref 78.0–100.0)
MONO ABS: 0.3 10*3/uL (ref 0.1–1.0)
Monocytes Relative: 5.9 % (ref 3.0–12.0)
NEUTROS PCT: 63.6 % (ref 43.0–77.0)
Neutro Abs: 3.3 10*3/uL (ref 1.4–7.7)
PLATELETS: 404 10*3/uL — AB (ref 150.0–400.0)
RBC: 4.72 Mil/uL (ref 3.87–5.11)
RDW: 14.2 % (ref 11.5–15.5)
WBC: 5.3 10*3/uL (ref 4.0–10.5)

## 2014-04-14 LAB — TSH: TSH: 0.5 u[IU]/mL (ref 0.35–4.50)

## 2014-04-14 LAB — LIPID PANEL
Cholesterol: 156 mg/dL (ref 0–200)
HDL: 49.3 mg/dL (ref >39.00)
LDL Cholesterol: 93 mg/dL (ref 0–99)
NonHDL: 106.7
Total CHOL/HDL Ratio: 3
Triglycerides: 68 mg/dL (ref 0.0–149.0)
VLDL: 13.6 mg/dL (ref 0.0–40.0)

## 2014-04-14 LAB — HEMOGLOBIN A1C: HEMOGLOBIN A1C: 5.7 % (ref 4.6–6.5)

## 2014-04-14 MED ORDER — CLORAZEPATE DIPOTASSIUM 7.5 MG PO TABS: ORAL_TABLET | ORAL | Status: DC

## 2014-04-14 NOTE — Patient Instructions (Signed)
High Cholesterol High cholesterol refers to having a high level of cholesterol in your blood. Cholesterol is a white, waxy, fat-like protein that your body needs in small amounts. Your liver makes all the cholesterol you need. Excess cholesterol comes from the food you eat. Cholesterol travels in your bloodstream through your blood vessels. If you have high cholesterol, deposits (plaque) may build up on the walls of your blood vessels. This makes the arteries narrower and stiffer. Plaque increases your risk of heart attack and stroke. Work with your health care Takyra Cantrall to keep your cholesterol levels in a healthy range. RISK FACTORS Several things can make you more likely to have high cholesterol. These include:   Eating foods high in animal fat (saturated fat) or cholesterol.  Being overweight.  Not getting enough exercise.  Having a family history of high cholesterol. SIGNS AND SYMPTOMS High cholesterol does not cause symptoms. DIAGNOSIS  Your health care Lorann Tani can do a blood test to check whether you have high cholesterol. If you are older than 20, your health care Janie Strothman may check your cholesterol every 4-6 years. You may be checked more often if you already have high cholesterol or other risk factors for heart disease. The blood test for cholesterol measures the following:  Bad cholesterol (LDL cholesterol). This is the type of cholesterol that causes heart disease. This number should be less than 100.  Good cholesterol (HDL cholesterol). This type helps protect against heart disease. A healthy level of HDL cholesterol is 60 or higher.  Total cholesterol. This is the combined number of LDL cholesterol and HDL cholesterol. A healthy number is less than 200. TREATMENT  High cholesterol can be treated with diet changes, lifestyle changes, and medicine.   Diet changes may include eating more whole grains, fruits, vegetables, nuts, and fish. You may also have to cut back on red meat  and foods with a lot of added sugar.  Lifestyle changes may include getting at least 40 minutes of aerobic exercise three times a week. Aerobic exercises include walking, biking, and swimming. Aerobic exercise along with a healthy diet can help you maintain a healthy weight. Lifestyle changes may also include quitting smoking.  If diet and lifestyle changes are not enough to lower your cholesterol, your health care Divina Neale may prescribe a statin medicine. This medicine has been shown to lower cholesterol and also lower the risk of heart disease. HOME CARE INSTRUCTIONS  Only take over-the-counter or prescription medicines as directed by your health care Teron Blais.   Follow a healthy diet as directed by your health care Rhylen Shaheen. For instance:   Eat chicken (without skin), fish, veal, shellfish, ground [place] breast, and round or loin cuts of red meat.  Do not eat fried foods and fatty meats, such as hot dogs and salami.   Eat plenty of fruits, such as apples.   Eat plenty of vegetables, such as broccoli, potatoes, and carrots.   Eat beans, peas, and lentils.   Eat grains, such as barley, rice, couscous, and bulgur wheat.   Eat pasta without cream sauces.   Use skim or nonfat milk and low-fat or nonfat yogurt and cheeses. Do not eat or drink whole milk, cream, ice cream, egg yolks, and hard cheeses.   Do not eat stick margarine or tub margarines that contain trans fats (also called partially hydrogenated oils).   Do not eat cakes, cookies, crackers, or other baked goods that contain trans fats.   Do not eat saturated tropical oils, such as  coconut and palm oil.   Exercise as directed by your health care Gerianne Simonet. Increase your activity level with activities such as gardening or walking.   Keep all follow-up appointments.  SEEK MEDICAL CARE IF:  You are struggling to maintain a healthy diet or weight.  You need help starting an exercise program.  You need help to  stop smoking. SEEK IMMEDIATE MEDICAL CARE IF:  You have chest pain.  You have trouble breathing. Document Released: 10/15/2005 Document Revised: 10/20/2013 Document Reviewed: 08/07/2013 [facility] Patient Information 2015 [place], [place]. This information is not intended to replace advice given to you by your health care Baila Rouse. Make sure you discuss any questions you have with your health care Ogle Hoeffner. Preventive Care for Adults A healthy lifestyle and preventive care can promote health and wellness. Preventive health guidelines for women include the following key practices.  A routine yearly physical is a good way to check with your health care Cayton Cuevas about your health and preventive screening. It is a chance to share any concerns and updates on your health and to receive a thorough exam.  Visit your dentist for a routine exam and preventive care every 6 months. Brush your teeth twice a day and floss once a day. Good oral hygiene prevents tooth decay and gum disease.  The frequency of eye exams is based on your age, health, family medical history, use of contact lenses, and other factors. Follow your health care Zyairah Wacha's recommendations for frequency of eye exams.  Eat a healthy diet. Foods like vegetables, fruits, whole grains, low-fat dairy products, and lean protein foods contain the nutrients you need without too many calories. Decrease your intake of foods high in solid fats, added sugars, and salt. Eat the right amount of calories for you.Get information about a proper diet from your health care Gabriel Conry, if necessary.  Regular physical exercise is one of the most important things you can do for your health. Most adults should get at least 150 minutes of moderate-intensity exercise (any activity that increases your heart rate and causes you to sweat) each week. In addition, most adults need muscle-strengthening exercises on 2 or more days a week.  Maintain a healthy weight. The  body mass index (BMI) is a screening tool to identify possible weight problems. It provides an estimate of body fat based on height and weight. Your health care Dat Derksen can find your BMI, and can help you achieve or maintain a healthy weight.For adults 20 years and older:  A BMI below 18.5 is considered underweight.  A BMI of 18.5 to 24.9 is normal.  A BMI of 25 to 29.9 is considered overweight.  A BMI of 30 and above is considered obese.  Maintain normal blood lipids and cholesterol levels by exercising and minimizing your intake of saturated fat. Eat a balanced diet with plenty of fruit and vegetables. Blood tests for lipids and cholesterol should begin at age 74 and be repeated every 5 years. If your lipid or cholesterol levels are high, you are over 50, or you are at high risk for heart disease, you may need your cholesterol levels checked more frequently.Ongoing high lipid and cholesterol levels should be treated with medicines if diet and exercise are not working.  If you smoke, find out from your health care Vaidehi Braddy how to quit. If you do not use tobacco, do not start.  Lung cancer screening is recommended for adults aged 71-80 years who are at high risk for developing lung cancer because of a  history of smoking. A yearly low-dose CT scan of the lungs is recommended for people who have at least a 30-pack-year history of smoking and are a current smoker or have quit within the past 15 years. A pack year of smoking is smoking an average of 1 pack of cigarettes a day for 1 year (for example: 1 pack a day for 30 years or 2 packs a day for 15 years). Yearly screening should continue until the smoker has stopped smoking for at least 15 years. Yearly screening should be stopped for people who develop a health problem that would prevent them from having lung cancer treatment.  If you are pregnant, do not drink alcohol. If you are breastfeeding, be very cautious about drinking alcohol. If you are  not pregnant and choose to drink alcohol, do not have more than 1 drink per day. One drink is considered to be 12 ounces (355 mL) of beer, 5 ounces (148 mL) of wine, or 1.5 ounces (44 mL) of liquor.  Avoid use of street drugs. Do not share needles with anyone. Ask for help if you need support or instructions about stopping the use of drugs.  High blood pressure causes heart disease and increases the risk of stroke. Your blood pressure should be checked at least every 1 to 2 years. Ongoing high blood pressure should be treated with medicines if weight loss and exercise do not work.  If you are 31-65 years old, ask your health care Rosio Weiss if you should take aspirin to prevent strokes.  Diabetes screening involves taking a blood sample to check your fasting blood sugar level. This should be done once every 3 years, after age 43, if you are within normal weight and without risk factors for diabetes. Testing should be considered at a younger age or be carried out more frequently if you are overweight and have at least 1 risk factor for diabetes.  Breast cancer screening is essential preventive care for women. You should practice "breast self-awareness." This means understanding the normal appearance and feel of your breasts and may include breast self-examination. Any changes detected, no matter how small, should be reported to a health care Timica Marcom. Women in their 6s and 30s should have a clinical breast exam (CBE) by a health care Nikya Busler as part of a regular health exam every 1 to 3 years. After age 77, women should have a CBE every year. Starting at age 86, women should consider having a mammogram (breast X-ray test) every year. Women who have a family history of breast cancer should talk to their health care Kailyn Vanderslice about genetic screening. Women at a high risk of breast cancer should talk to their health care providers about having an MRI and a mammogram every year.  Breast cancer gene  (BRCA)-related cancer risk assessment is recommended for women who have family members with BRCA-related cancers. BRCA-related cancers include breast, ovarian, tubal, and peritoneal cancers. Having family members with these cancers may be associated with an increased risk for harmful changes (mutations) in the breast cancer genes BRCA1 and BRCA2. Results of the assessment will determine the need for genetic counseling and BRCA1 and BRCA2 testing.  Routine pelvic exams to screen for cancer are no longer recommended for nonpregnant women who are considered low risk for cancer of the pelvic organs (ovaries, uterus, and vagina) and who do not have symptoms. Ask your health care Million Maharaj if a screening pelvic exam is right for you.  If you have had past treatment for  cervical cancer or a condition that could lead to cancer, you need Pap tests and screening for cancer for at least 20 years after your treatment. If Pap tests have been discontinued, your risk factors (such as having a new sexual partner) need to be reassessed to determine if screening should be resumed. Some women have medical problems that increase the chance of getting cervical cancer. In these cases, your health care Florabel Faulks may recommend more frequent screening and Pap tests.  The HPV test is an additional test that may be used for cervical cancer screening. The HPV test looks for the virus that can cause the cell changes on the cervix. The cells collected during the Pap test can be tested for HPV. The HPV test could be used to screen women aged 18 years and older, and should be used in women of any age who have unclear Pap test results. After the age of 34, women should have HPV testing at the same frequency as a Pap test.  Colorectal cancer can be detected and often prevented. Most routine colorectal cancer screening begins at the age of 84 years and continues through age 7 years. However, your health care Joh Rao may recommend screening at  an earlier age if you have risk factors for colon cancer. On a yearly basis, your health care Arleen Bar may provide home test kits to check for hidden blood in the stool. Use of a small camera at the end of a tube, to directly examine the colon (sigmoidoscopy or colonoscopy), can detect the earliest forms of colorectal cancer. Talk to your health care Zionah Criswell about this at age 74, when routine screening begins. Direct exam of the colon should be repeated every 5-10 years through age 41 years, unless early forms of pre-cancerous polyps or small growths are found.  People who are at an increased risk for hepatitis B should be screened for this virus. You are considered at high risk for hepatitis B if:  You were born in a country where hepatitis B occurs often. Talk with your health care Jull Harral about which countries are considered high risk.  Your parents were born in a high-risk country and you have not received a shot to protect against hepatitis B (hepatitis B vaccine).  You have HIV or AIDS.  You use needles to inject street drugs.  You live with, or have sex with, someone who has Hepatitis B.  You get hemodialysis treatment.  You take certain medicines for conditions like cancer, organ transplantation, and autoimmune conditions.  Hepatitis C blood testing is recommended for all people born from [birth year] through 1964 and any individual with known risks for hepatitis C.  Practice safe sex. Use condoms and avoid high-risk sexual practices to reduce the spread of sexually transmitted infections (STIs). STIs include gonorrhea, chlamydia, syphilis, trichomonas, herpes, HPV, and human immunodeficiency virus (HIV). Herpes, HIV, and HPV are viral illnesses that have no cure. They can result in disability, cancer, and death.  You should be screened for sexually transmitted illnesses (STIs) including gonorrhea and chlamydia if:  You are sexually active and are younger than 24 years.  You are older  than 24 years and your health care Myan Suit tells you that you are at risk for this type of infection.  Your sexual activity has changed since you were last screened and you are at an increased risk for chlamydia or gonorrhea. Ask your health care Jaidence Geisler if you are at risk.  If you are at risk of being infected with  HIV, it is recommended that you take a prescription medicine daily to prevent HIV infection. This is called preexposure prophylaxis (PrEP). You are considered at risk if:  You are a heterosexual woman, are sexually active, and are at increased risk for HIV infection.  You take drugs by injection.  You are sexually active with a partner who has HIV.  Talk with your health care Harveen Flesch about whether you are at high risk of being infected with HIV. If you choose to begin PrEP, you should first be tested for HIV. You should then be tested every 3 months for as long as you are taking PrEP.  Osteoporosis is a disease in which the bones lose minerals and strength with aging. This can result in serious bone fractures or breaks. The risk of osteoporosis can be identified using a bone density scan. Women ages 39 years and over and women at risk for fractures or osteoporosis should discuss screening with their health care providers. Ask your health care Eldred Sooy whether you should take a calcium supplement or vitamin D to reduce the rate of osteoporosis.  Menopause can be associated with physical symptoms and risks. Hormone replacement therapy is available to decrease symptoms and risks. You should talk to your health care Sheriece Jefcoat about whether hormone replacement therapy is right for you.  Use sunscreen. Apply sunscreen liberally and repeatedly throughout the day. You should seek shade when your shadow is shorter than you. Protect yourself by wearing long sleeves, pants, a wide-brimmed hat, and sunglasses year round, whenever you are outdoors.  Once a month, do a whole body skin exam, using  a mirror to look at the skin on your back. Tell your health care Marcellina Jonsson of new moles, moles that have irregular borders, moles that are larger than a pencil eraser, or moles that have changed in shape or color.  Stay current with required vaccines (immunizations).  Influenza vaccine. All adults should be immunized every year.  Tetanus, diphtheria, and acellular pertussis (Td, Tdap) vaccine. Pregnant women should receive 1 dose of Tdap vaccine during each pregnancy. The dose should be obtained regardless of the length of time since the last dose. Immunization is preferred during the 27th-36th week of gestation. An adult who has not previously received Tdap or who does not know her vaccine status should receive 1 dose of Tdap. This initial dose should be followed by tetanus and diphtheria toxoids (Td) booster doses every 10 years. Adults with an unknown or incomplete history of completing a 3-dose immunization series with Td-containing vaccines should begin or complete a primary immunization series including a Tdap dose. Adults should receive a Td booster every 10 years.  Varicella vaccine. An adult without evidence of immunity to varicella should receive 2 doses or a second dose if she has previously received 1 dose. Pregnant females who do not have evidence of immunity should receive the first dose after pregnancy. This first dose should be obtained before leaving the health care facility. The second dose should be obtained 4-8 weeks after the first dose.  Human papillomavirus (HPV) vaccine. Females aged 13-26 years who have not received the vaccine previously should obtain the 3-dose series. The vaccine is not recommended for use in pregnant females. However, pregnancy testing is not needed before receiving a dose. If a female is found to be pregnant after receiving a dose, no treatment is needed. In that case, the remaining doses should be delayed until after the pregnancy. Immunization is recommended  for any person with an  immunocompromised condition through the age of 29 years if she did not get any or all doses earlier. During the 3-dose series, the second dose should be obtained 4-8 weeks after the first dose. The third dose should be obtained 24 weeks after the first dose and 16 weeks after the second dose.  Zoster vaccine. One dose is recommended for adults aged 19 years or older unless certain conditions are present.  Measles, mumps, and rubella (MMR) vaccine. Adults born before [birth year] generally are considered immune to measles and mumps. Adults born in [birth year] or later should have 1 or more doses of MMR vaccine unless there is a contraindication to the vaccine or there is laboratory evidence of immunity to each of the three diseases. A routine second dose of MMR vaccine should be obtained at least 28 days after the first dose for students attending postsecondary schools, health care workers, or international travelers. People who received inactivated measles vaccine or an unknown type of measles vaccine during 1963-1967 should receive 2 doses of MMR vaccine. People who received inactivated mumps vaccine or an unknown type of mumps vaccine before 1979 and are at high risk for mumps infection should consider immunization with 2 doses of MMR vaccine. For females of childbearing age, rubella immunity should be determined. If there is no evidence of immunity, females who are not pregnant should be vaccinated. If there is no evidence of immunity, females who are pregnant should delay immunization until after pregnancy. Unvaccinated health care workers born before 25 who lack laboratory evidence of measles, mumps, or rubella immunity or laboratory confirmation of disease should consider measles and mumps immunization with 2 doses of MMR vaccine or rubella immunization with 1 dose of MMR vaccine.  Pneumococcal 13-valent conjugate (PCV13) vaccine. When indicated, a person who is uncertain of her immunization  history and has no record of immunization should receive the PCV13 vaccine. An adult aged 25 years or older who has certain medical conditions and has not been previously immunized should receive 1 dose of PCV13 vaccine. This PCV13 should be followed with a dose of pneumococcal polysaccharide (PPSV23) vaccine. The PPSV23 vaccine dose should be obtained at least 8 weeks after the dose of PCV13 vaccine. An adult aged 18 years or older who has certain medical conditions and previously received 1 or more doses of PPSV23 vaccine should receive 1 dose of PCV13. The PCV13 vaccine dose should be obtained 1 or more years after the last PPSV23 vaccine dose.  Pneumococcal polysaccharide (PPSV23) vaccine. When PCV13 is also indicated, PCV13 should be obtained first. All adults aged 10 years and older should be immunized. An adult younger than age 71 years who has certain medical conditions should be immunized. Any person who resides in a nursing home or long-term care facility should be immunized. An adult smoker should be immunized. People with an immunocompromised condition and certain other conditions should receive both PCV13 and PPSV23 vaccines. People with human immunodeficiency virus (HIV) infection should be immunized as soon as possible after diagnosis. Immunization during chemotherapy or radiation therapy should be avoided. Routine use of PPSV23 vaccine is not recommended for American Indians, [place] Natives, or people younger than 65 years unless there are medical conditions that require PPSV23 vaccine. When indicated, people who have unknown immunization and have no record of immunization should receive PPSV23 vaccine. One-time revaccination 5 years after the first dose of PPSV23 is recommended for people aged 19-64 years who have chronic kidney failure, nephrotic syndrome, asplenia, or immunocompromised conditions. People  who received 1-2 doses of PPSV23 before age 26 years should receive another dose of PPSV23  vaccine at age 100 years or later if at least 5 years have passed since the previous dose. Doses of PPSV23 are not needed for people immunized with PPSV23 at or after age 10 years.  Meningococcal vaccine. Adults with asplenia or persistent complement component deficiencies should receive 2 doses of quadrivalent meningococcal conjugate (MenACWY-D) vaccine. The doses should be obtained at least 2 months apart. Microbiologists working with certain meningococcal bacteria, [employer] recruits, people at risk during an outbreak, and people who travel to or live in countries with a high rate of meningitis should be immunized. A first-year college student up through age 46 years who is living in a residence hall should receive a dose if she did not receive a dose on or after her 16th birthday. Adults who have certain high-risk conditions should receive one or more doses of vaccine.  Hepatitis A vaccine. Adults who wish to be protected from this disease, have certain high-risk conditions, work with hepatitis A-infected animals, work in hepatitis A research labs, or travel to or work in countries with a high rate of hepatitis A should be immunized. Adults who were previously unvaccinated and who anticipate close contact with an international adoptee during the first 60 days after arrival in the [place] from a country with a high rate of hepatitis A should be immunized.  Hepatitis B vaccine. Adults who wish to be protected from this disease, have certain high-risk conditions, may be exposed to blood or other infectious body fluids, are household contacts or sex partners of hepatitis B positive people, are clients or workers in certain care facilities, or travel to or work in countries with a high rate of hepatitis B should be immunized.  Haemophilus influenzae type b (Hib) vaccine. A previously unvaccinated person with asplenia or sickle cell disease or having a scheduled splenectomy should receive 1 dose of Hib  vaccine. Regardless of previous immunization, a recipient of a hematopoietic stem cell transplant should receive a 3-dose series 6-12 months after her successful transplant. Hib vaccine is not recommended for adults with HIV infection. Preventive Services / Frequency Ages 48 to 39years  Blood pressure check.** / Every 1 to 2 years.  Lipid and cholesterol check.** / Every 5 years beginning at age 92.  Clinical breast exam.** / Every 3 years for women in their 45s and 29s.  BRCA-related cancer risk assessment.** / For women who have family members with a BRCA-related cancer (breast, ovarian, tubal, or peritoneal cancers).  Pap test.** / Every 2 years from ages 46 through 40. Every 3 years starting at age 20 through age 14 or 33 with a history of 3 consecutive normal Pap tests.  HPV screening.** / Every 3 years from ages 71 through ages 91 to 46 with a history of 3 consecutive normal Pap tests.  Hepatitis C blood test.** / For any individual with known risks for hepatitis C.  Skin self-exam. / Monthly.  Influenza vaccine. / Every year.  Tetanus, diphtheria, and acellular pertussis (Tdap, Td) vaccine.** / Consult your health care Hildagarde Holleran. Pregnant women should receive 1 dose of Tdap vaccine during each pregnancy. 1 dose of Td every 10 years.  Varicella vaccine.** / Consult your health care Linday Rhodes. Pregnant females who do not have evidence of immunity should receive the first dose after pregnancy.  HPV vaccine. / 3 doses over 6 months, if 43 and younger. The vaccine is not recommended for  use in pregnant females. However, pregnancy testing is not needed before receiving a dose.  Measles, mumps, rubella (MMR) vaccine.** / You need at least 1 dose of MMR if you were born in 1957 or later. You may also need a 2nd dose. For females of childbearing age, rubella immunity should be determined. If there is no evidence of immunity, females who are not pregnant should be vaccinated. If there is no  evidence of immunity, females who are pregnant should delay immunization until after pregnancy.  Pneumococcal 13-valent conjugate (PCV13) vaccine.** / Consult your health care Khristopher Kapaun.  Pneumococcal polysaccharide (PPSV23) vaccine.** / 1 to 2 doses if you smoke cigarettes or if you have certain conditions.  Meningococcal vaccine.** / 1 dose if you are age 36 to 27 years and a Orthoptist living in a residence hall, or have one of several medical conditions, you need to get vaccinated against meningococcal disease. You may also need additional booster doses.  Hepatitis A vaccine.** / Consult your health care Danzig Macgregor.  Hepatitis B vaccine.** / Consult your health care Kelbie Moro.  Haemophilus influenzae type b (Hib) vaccine.** / Consult your health care Emilyrose Darrah. Ages 57 to 64years  Blood pressure check.** / Every 1 to 2 years.  Lipid and cholesterol check.** / Every 5 years beginning at age 34 years.  Lung cancer screening. / Every year if you are aged 55-80 years and have a 30-pack-year history of smoking and currently smoke or have quit within the past 15 years. Yearly screening is stopped once you have quit smoking for at least 15 years or develop a health problem that would prevent you from having lung cancer treatment.  Clinical breast exam.** / Every year after age 21 years.  BRCA-related cancer risk assessment.** / For women who have family members with a BRCA-related cancer (breast, ovarian, tubal, or peritoneal cancers).  Mammogram.** / Every year beginning at age 67 years and continuing for as long as you are in good health. Consult with your health care Tatyanna Cronk.  Pap test.** / Every 3 years starting at age 67 years through age 10 or 77 years with a history of 3 consecutive normal Pap tests.  HPV screening.** / Every 3 years from ages 17 years through ages 82 to 71 years with a history of 3 consecutive normal Pap tests.  Fecal occult blood test (FOBT) of  stool. / Every year beginning at age 89 years and continuing until age 22 years. You may not need to do this test if you get a colonoscopy every 10 years.  Flexible sigmoidoscopy or colonoscopy.** / Every 5 years for a flexible sigmoidoscopy or every 10 years for a colonoscopy beginning at age 50 years and continuing until age 7 years.  Hepatitis C blood test.** / For all people born from [birth year] through 1964 and any individual with known risks for hepatitis C.  Skin self-exam. / Monthly.  Influenza vaccine. / Every year.  Tetanus, diphtheria, and acellular pertussis (Tdap/Td) vaccine.** / Consult your health care Jesua Tamblyn. Pregnant women should receive 1 dose of Tdap vaccine during each pregnancy. 1 dose of Td every 10 years.  Varicella vaccine.** / Consult your health care Kaileia Flow. Pregnant females who do not have evidence of immunity should receive the first dose after pregnancy.  Zoster vaccine.** / 1 dose for adults aged 81 years or older.  Measles, mumps, rubella (MMR) vaccine.** / You need at least 1 dose of MMR if you were born in 1957 or later. You may also need  a 2nd dose. For females of childbearing age, rubella immunity should be determined. If there is no evidence of immunity, females who are not pregnant should be vaccinated. If there is no evidence of immunity, females who are pregnant should delay immunization until after pregnancy.  Pneumococcal 13-valent conjugate (PCV13) vaccine.** / Consult your health care Teller Wakefield.  Pneumococcal polysaccharide (PPSV23) vaccine.** / 1 to 2 doses if you smoke cigarettes or if you have certain conditions.  Meningococcal vaccine.** / Consult your health care Ameli Sangiovanni.  Hepatitis A vaccine.** / Consult your health care Kavir Savoca.  Hepatitis B vaccine.** / Consult your health care Candise Crabtree.  Haemophilus influenzae type b (Hib) vaccine.** / Consult your health care Briteny Fulghum. Ages 50 years and over  Blood pressure check.** / Every 1 to 2  years.  Lipid and cholesterol check.** / Every 5 years beginning at age 48 years.  Lung cancer screening. / Every year if you are aged 41-80 years and have a 30-pack-year history of smoking and currently smoke or have quit within the past 15 years. Yearly screening is stopped once you have quit smoking for at least 15 years or develop a health problem that would prevent you from having lung cancer treatment.  Clinical breast exam.** / Every year after age 48 years.  BRCA-related cancer risk assessment.** / For women who have family members with a BRCA-related cancer (breast, ovarian, tubal, or peritoneal cancers).  Mammogram.** / Every year beginning at age 43 years and continuing for as long as you are in good health. Consult with your health care Tranquilino Fischler.  Pap test.** / Every 3 years starting at age 79 years through age 75 or 40 years with 3 consecutive normal Pap tests. Testing can be stopped between 65 and 70 years with 3 consecutive normal Pap tests and no abnormal Pap or HPV tests in the past 10 years.  HPV screening.** / Every 3 years from ages 39 years through ages 51 or 4 years with a history of 3 consecutive normal Pap tests. Testing can be stopped between 65 and 70 years with 3 consecutive normal Pap tests and no abnormal Pap or HPV tests in the past 10 years.  Fecal occult blood test (FOBT) of stool. / Every year beginning at age 7 years and continuing until age 8 years. You may not need to do this test if you get a colonoscopy every 10 years.  Flexible sigmoidoscopy or colonoscopy.** / Every 5 years for a flexible sigmoidoscopy or every 10 years for a colonoscopy beginning at age 47 years and continuing until age 29 years.  Hepatitis C blood test.** / For all people born from [birth year] through 1965 and any individual with known risks for hepatitis C.  Osteoporosis screening.** / A one-time screening for women ages 71 years and over and women at risk for fractures or  osteoporosis.  Skin self-exam. / Monthly.  Influenza vaccine. / Every year.  Tetanus, diphtheria, and acellular pertussis (Tdap/Td) vaccine.** / 1 dose of Td every 10 years.  Varicella vaccine.** / Consult your health care Elian Gloster.  Zoster vaccine.** / 1 dose for adults aged 38 years or older.  Pneumococcal 13-valent conjugate (PCV13) vaccine.** / Consult your health care Jeanann Balinski.  Pneumococcal polysaccharide (PPSV23) vaccine.** / 1 dose for all adults aged 35 years and older.  Meningococcal vaccine.** / Consult your health care Tobie Hellen.  Hepatitis A vaccine.** / Consult your health care Debborah Alonge.  Hepatitis B vaccine.** / Consult your health care Reyansh Kushnir.  Haemophilus influenzae type b (Hib) vaccine.** /  Consult your health care Hudsyn Barich. ** Family history and personal history of risk and conditions may change your health care Chrstopher Malenfant's recommendations. Document Released: 12/11/2001 Document Revised: 10/20/2013 Document Reviewed: 03/12/2011 [facility] Patient Information 2015 [place], [place]. This information is not intended to replace advice given to you by your health care Dulcie Gammon. Make sure you discuss any questions you have with your health care Feleica Fulmore.

## 2014-04-14 NOTE — Progress Notes (Signed)
Pre visit review using our clinic review tool, if applicable. No additional management support is needed unless otherwise documented below in the visit note. 

## 2014-04-14 NOTE — Progress Notes (Signed)
Subjective:    Patient ID: [name], female    DOB: 02/16/1941, 73 y.o.   MRN: [id number]  Rash This is a chronic problem. The current episode started more than 1 year ago. The affected locations include the left foot and right foot. The rash is characterized by scaling and redness. She was exposed to nothing. Pertinent negatives include no anorexia, congestion, cough, diarrhea, eye pain, facial edema, fatigue, fever, joint pain, nail changes, rhinorrhea, shortness of breath, sore throat or vomiting. Past treatments include topical steroids. The treatment provided moderate relief. (Psoriasis)      Review of Systems  Constitutional: Negative.  Negative for fever, chills, diaphoresis, appetite change and fatigue.  HENT: Negative.  Negative for congestion, rhinorrhea and sore throat.   Eyes: Negative.  Negative for pain.  Respiratory: Negative.  Negative for apnea, cough, choking, chest tightness, shortness of breath, wheezing and stridor.   Cardiovascular: Negative.  Negative for chest pain, palpitations and leg swelling.  Gastrointestinal: Negative.  Negative for nausea, vomiting, abdominal pain, diarrhea, constipation and anorexia.  Endocrine: Negative.   Genitourinary: Negative.   Musculoskeletal: Negative.  Negative for arthralgias, back pain, joint pain, myalgias and neck pain.  Skin: Positive for rash. Negative for nail changes.  Allergic/Immunologic: Negative.   Neurological: Negative.  Negative for dizziness, tremors, weakness, light-headedness, numbness and headaches.  Hematological: Negative.  Negative for adenopathy. Does not bruise/bleed easily.  Psychiatric/Behavioral: Positive for sleep disturbance. Negative for suicidal ideas, hallucinations, behavioral problems, confusion, self-injury, dysphoric mood, decreased concentration and agitation. The patient is nervous/anxious. The patient is not hyperactive.        Objective:   Physical Exam  Constitutional: She is  oriented to person, place, and time. She appears well-developed and well-nourished. No distress.  HENT:  Head: Normocephalic and atraumatic.  Mouth/Throat: Oropharynx is clear and moist. No oropharyngeal exudate.  Eyes: Conjunctivae are normal. Right eye exhibits no discharge. Left eye exhibits no discharge. No scleral icterus.  Neck: Normal range of motion. Neck supple. No JVD present. No tracheal deviation present. No thyromegaly present.  Cardiovascular: Normal rate, regular rhythm, normal heart sounds and intact distal pulses.  Exam reveals no gallop and no friction rub.   No murmur heard. Pulmonary/Chest: Effort normal and breath sounds normal. No stridor. No respiratory distress. She has no wheezes. She has no rales. She exhibits no tenderness.  Abdominal: Soft. Bowel sounds are normal. She exhibits no distension and no mass. There is no tenderness. There is no rebound and no guarding.  Musculoskeletal: Normal range of motion. She exhibits no edema and no tenderness.  Lymphadenopathy:    She has no cervical adenopathy.  Neurological: She is oriented to person, place, and time.  Skin: Skin is warm, dry and intact. Rash noted. No abrasion, no burn, no ecchymosis, no laceration, no lesion, no petechiae and no purpura noted. Rash is pustular. Rash is not macular, not papular, not maculopapular, not nodular, not vesicular and not urticarial. She is not diaphoretic. No erythema. No pallor.  She has diffuse erythema with plaques and pustules over both feet  Psychiatric: She has a normal mood and affect. Her behavior is normal. Judgment and thought content normal.     Lab Results  Component Value Date   WBC 6.6 06/16/2013   HGB 14.9 06/16/2013   HCT 43.7 06/16/2013   PLT 400.0 06/16/2013   GLUCOSE 100* 06/16/2013   CHOL 140 06/16/2013   TRIG 52.0 06/16/2013   HDL 47.50 06/16/2013   LDLCALC 82  06/16/2013   ALT 12 06/16/2013   AST 12 06/16/2013   NA 134* 06/16/2013   K 4.7 06/16/2013   CL 101  06/16/2013   CREATININE 0.7 06/16/2013   BUN 11 06/16/2013   CO2 29 06/16/2013   TSH 1.65 06/16/2013   INR 1.0 04/22/2009   HGBA1C 5.7 12/10/2012       Assessment & Plan:

## 2014-04-15 DIAGNOSIS — L408 Other psoriasis: Secondary | ICD-10-CM | POA: Insufficient documentation

## 2014-04-15 NOTE — Assessment & Plan Note (Signed)
Cont tranxene as needed

## 2014-04-15 NOTE — Assessment & Plan Note (Addendum)

## 2014-04-15 NOTE — Assessment & Plan Note (Signed)
I have asked her to see ENDO to consider treating this

## 2014-04-15 NOTE — Assessment & Plan Note (Signed)
I will recheck her CBC today 

## 2014-04-15 NOTE — Assessment & Plan Note (Signed)
She will cont tranxene as needed

## 2014-04-15 NOTE — Assessment & Plan Note (Signed)
I have asked her to see her derm about this

## 2014-04-15 NOTE — Assessment & Plan Note (Signed)
She has achieved her LDL goal and does not want to take a statin 

## 2014-04-26 ENCOUNTER — Telehealth: Payer: Self-pay | Admitting: *Deleted

## 2014-04-26 NOTE — Telephone Encounter (Signed)
Called pt no answer LMOM with md response.../lmb 

## 2014-04-26 NOTE — Telephone Encounter (Signed)
Pt states she received the prevnar injection about 10 days ago. At the injections site it is swollen and red, and a little warm. It was itching couples days after getting injection & she started taking some benadryl. Wanting to know what md recommends...[name]

## 2014-04-26 NOTE — Telephone Encounter (Signed)
No treatment needed.

## 2014-04-28 ENCOUNTER — Encounter: Payer: Self-pay | Admitting: Internal Medicine

## 2014-04-28 ENCOUNTER — Ambulatory Visit (INDEPENDENT_AMBULATORY_CARE_PROVIDER_SITE_OTHER): Payer: Medicare Other | Admitting: Internal Medicine

## 2014-04-28 VITALS — BP 126/74 | HR 95 | Temp 98.3°F | Resp 12 | Ht 67.0 in | Wt 126.0 lb

## 2014-04-28 DIAGNOSIS — M810 Age-related osteoporosis without current pathological fracture: Secondary | ICD-10-CM

## 2014-04-28 LAB — VITAMIN D 25 HYDROXY (VIT D DEFICIENCY, FRACTURES): VITD: 48.13 ng/mL

## 2014-04-28 NOTE — Patient Instructions (Signed)
Please stop at the lab. We will call you with the bone mineral density scan schedule (at [facility]). Please try to eat ~50 g proteins a day. Please try to get ~1200 mg calcium from diet and supplements in a day.  Exercise for Strong Bones (from [place]) There are two types of exercises that are important for building and maintaining bone density:  weight-bearing and muscle-strengthening exercises. Weight-bearing Exercises These exercises include activities that make you move against gravity while staying upright. Weight-bearing exercises can be high-impact or low-impact. High-impact weight-bearing exercises help build bones and keep them strong. If you have broken a bone due to osteoporosis or are at risk of breaking a bone, you may need to avoid high-impact exercises. If you're not sure, you should check with your healthcare provider. Examples of high-impact weight-bearing exercises are:   Dancing   Doing high-impact aerobics   Hiking   Jogging/running   Jumping Rope   Stair climbing   Tennis Low-impact weight-bearing exercises can also help keep bones strong and are a safe alternative if you cannot do high-impact exercises. Examples of low-impact weight-bearing exercises are:   Using elliptical training machines   Doing low-impact aerobics   Using stair-step machines   Fast walking on a treadmill or outside Muscle-Strengthening Exercises These exercises include activities where you move your body, a weight or some other resistance against gravity. They are also known as resistance exercises and include:   Lifting weights   Using elastic exercise bands   Using weight machines   Lifting your own body weight   Functional movements, such as standing and rising up on your toes Yoga and Pilates can also improve strength, balance and flexibility. However, certain positions may not be safe for people with osteoporosis or those at increased risk of broken bones. For  example, exercises that have you bend forward may increase the chance of breaking a bone in the spine. A physical therapist should be able to help you learn which exercises are safe and appropriate for you. Non-Impact Exercises Non-impact exercises can help you to improve balance, posture and how well you move in everyday activities. These exercises can also help to increase muscle strength and decrease the risk of falls and broken bones. Some of these exercises include:   Balance exercises that strengthen your legs and test your balance, such as Tai Chi, can decrease your risk of falls.   Posture exercises that improve your posture and reduce rounded or "sloping" shoulders can help you decrease the chance of breaking a bone, especially in the spine.   Functional exercises that improve how well you move can help you with everyday activities and decrease your chance of falling and breaking a bone. For example, if you have trouble getting up from a chair or climbing stairs, you should do these activities as exercises. A physical therapist can teach you balance, posture and functional exercises. Starting a New Exercise Program If you haven't exercised regularly for a while, check with your healthcare provider before beginning a new exercise program-particularly if you have health problems such as heart disease, diabetes or high blood pressure. If you're at high risk of breaking a bone, you should work with a physical therapist to develop a safe exercise program. Once you have your healthcare provider's approval, start slowly. If you've already broken bones in the spine because of osteoporosis, be very careful to avoid activities that require reaching down, bending forward, rapid twisting motions, heavy lifting and those that increase  your chance of a fall. As you get started, your muscles may feel sore for a day or two after you exercise. If soreness lasts longer, you may be working too hard and need to ease  up. Exercises should be done in a pain-free range of motion. How Much Exercise Do You Need? Weight-bearing exercises 30 minutes on most days of the week. Do a 30-minutesession or multiple sessions spread out throughout the day. The benefits to your bones are the same.   Muscle-strengthening exercises Two to three days per week. If you don't have much time for strengthening/resistance training, do small amounts at a time. You can do just one body part each day. For example do arms one day, legs the next and trunk the next. You can also spread these exercises out during your normal day.  Balance, posture and functional exercises Every day or as often as needed. You may want to focus on one area more than the others. If you have fallen or lose your balance, spend time doing balance exercises. If you are getting rounded shoulders, work more on posture exercises. If you have trouble climbing stairs or getting up from the couch, do more functional exercises. You can also perform these exercises at one time or spread them during your day. Work with a phyiscal therapist to learn the right exercises for you.    How Can I Prevent Falls? Men and women with osteoporosis need to take care not to fall down. Falls can break bones. Some reasons people fall are: Poor vision  Poor balance  Certain diseases that affect how you walk  Some types of medicine, such as sleeping pills.  Some tips to help prevent falls outdoors are: Use a cane or walker  Wear rubber-soled shoes so you don't slip  Walk on grass when sidewalks are slippery  In winter, put salt or kitty litter on icy sidewalks.  Some ways to help prevent falls indoors are: Keep rooms free of clutter, especially on floors  Use plastic or carpet runners on slippery floors  Wear low-heeled shoes that provide good support  Do not walk in socks, stockings, or slippers  Be sure carpets and area rugs have skid-proof backs or are tacked to the floor  Be sure  stairs are well lit and have rails on both sides  Put grab bars on bathroom walls near tub, shower, and toilet  Use a rubber bath mat in the shower or tub  Keep a flashlight next to your bed  Use a sturdy step stool with a handrail and wide steps  Add more lights in rooms (and night lights) Buy a cordless phone to keep with you so that you don't have to rush to the phone       when it rings and so that you can call for help if you fall.   (adapted from http://www.niams.NightlifePreviews.se)  Dietary sources of calcium and vitamin D:  Calcium content (mg) - http://www.niams.MoviePins.co.za  Fortified oatmeal, 1 packet 350  Sardines, canned in oil, with edible bones, 3 oz. 324  Cheddar cheese, 1 oz. shredded 306  Milk, nonfat, 1 cup 302  Milkshake, 1 cup 300  Yogurt, plain, low-fat, 1 cup 300  Soybeans, cooked, 1 cup 261  Tofu, firm, with calcium,  cup 204  Orange juice, fortified with calcium, 6 oz. 200-260 (varies)  Salmon, canned, with edible bones, 3 oz. 181  Pudding, instant, made with 2% milk,  cup 153  Baked beans, 1 cup 142  Cottage cheese, 1% milk fat, 1 cup 138  Spaghetti, lasagna, 1 cup 125  Frozen yogurt, vanilla, soft-serve,  cup 103  Ready-to-eat cereal, fortified with calcium, 1 cup 100-1,000 (varies)  Cheese pizza, 1 slice 816  Fortified waffles, 2 100  Turnip greens, boiled,  cup 99  Broccoli, raw, 1 cup 90  Ice cream, vanilla,  cup 85  Soy or rice milk, fortified with calcium, 1 cup 80-500 (varies)   Vitamin D content (International Units, IU) - https://www.ars.usda.gov Cod liver oil, 1 tablespoon 1,360  Swordfish, cooked, 3 oz 566  Salmon (sockeye), cooked, 3 oz 447  Tuna fish, canned in water, drained, 3 oz 154  Orange juice fortified with vitamin D, 1 cup (check product labels, as amount of added vitamin D varies) 137  Milk, nonfat, reduced fat, and whole, vitamin D-fortified, 1 cup 115-124   Yogurt, fortified with 20% of the daily value for vitamin D, 6 oz 80  Margarine, fortified, 1 tablespoon 60  Sardines, canned in oil, drained, 2 sardines 46  Liver, beef, cooked, 3 oz 42  Egg, 1 large (vitamin D is found in yolk) 41  Ready-to-eat cereal, fortified with 10% of the daily value for vitamin D, 0.75-1 cup  40  Cheese, Swiss, 1 oz 6

## 2014-04-28 NOTE — Progress Notes (Signed)
Patient ID: [name], female   DOB: Mar 16, 1941, 73 y.o.   MRN: [id number]   HPI  [name] is a 73 y.o.-year-old female, referred by her PCP, Dr. Ronnald Ramp, for management of osteoporosis.  Pt was dx with OP in 1990's. She denies any fractures, had a fall last week. No dizziness/vertigo/orthostasis.  I reviewed pt's DEXA scans: Date L1-L4 T score LFN T score RFN T score 33% distal Radius  07/07/2012 ([facility]) Inaccurate (calcif) -2.7 -2.8 n/a  02/21/2010 [facility]) n/a -2.7 -3.0 -1.8  07/14/2008 [facility]) n/a -2.6 -2.8 -1.5  05/24/2005 [facility]) n/a -2.3 -2.7 -1.2   She has been on the following OP treatments:  - Miacalcin nasal spray - <1 year - Fosamax - <1 year - Actonel - <1 year Tried all these but could not tolerate them >> slowed her down, could not function. She has pbs with most meds >> diarrhea - she is on Estrace 0.3 mg - started at hysterectomy  No h/o hyper/hypocalcemia. No h/o hyperparathyroidism. No h/o kidney stones. Lab Results  Component Value Date   CALCIUM 9.1 04/14/2014   CALCIUM 9.3 06/16/2013   CALCIUM 9.1 12/31/2012   CALCIUM 9.8 12/10/2012   CALCIUM 9.1 06/24/2012   CALCIUM 9.3 05/08/2011   CALCIUM 8.8 03/20/2010   CALCIUM 9.3 04/22/2009   CALCIUM 9.0 07/14/2008   CALCIUM 9.3 07/21/2007   No h/o thyrotoxicosis. Reviewed TSH recent levels:  Lab Results  Component Value Date   TSH 0.50 04/14/2014   TSH 1.65 06/16/2013   TSH 0.95 05/08/2011   TSH 1.36 03/20/2010   TSH 1.368  04/24/2009   No h/o vitamin D deficiency. No vit D level available.  She lost weight: 8 lbs since retiring.  No h/o CKD. Last BUN/Cr: Lab Results  Component Value Date   BUN 8 04/14/2014   CREATININE 0.9 04/14/2014   Pt is on calcium and vitamin D in MVI. She also eats dairy and green, leafy, vegetables. Also drinks Boost x 1 a day. She does not take high vitamin A doses. No weight bearing exercises.   She takes Nexium for GERD. Now uses Peptobismol prn.   Pt does not have a FH  of osteoporosis. No FH of kyphosis.   Menopause was at 73 y/o - hysterectomy.   ROS: Constitutional: + weight loss, + fatigue, no subjective hyperthermia/hypothermia Eyes: no blurry vision, no xerophthalmia ENT: no sore throat, no nodules palpated in throat, no dysphagia/odynophagia, no hoarseness Cardiovascular: no CP/+ SOB/+ palpitations/no leg swelling Respiratory: + cough/+ SOB Gastrointestinal: + N/no V/+ D/no C/+ heartburn Musculoskeletal: no muscle/joint aches Skin: no rashes, + easy bruising Neurological: no tremors/numbness/tingling/dizziness, + HA Psychiatric: no depression/anxiety  Past Medical History  Diagnosis Date  . Heart murmur     childhood  . Ruptured eardrum     childhood  . Hemorrhoids, internal   . Hx: UTI (urinary tract infection)   . Necrobiosis lipoidica   . GERD (gastroesophageal reflux disease)   . Osteoarthritis   . Osteoporosis   . Anxiety   . Anxiety state, unspecified 03/02/2008  . ECZEMA, HX OF 07/25/2007    psoriasis  . GERD 07/25/2007  . Irritable bowel syndrome 08/18/2009  . OSTEOARTHRITIS 07/25/2007  . OSTEOPOROSIS 07/25/2007  . Palpitations 05/27/2009  . SYNCOPE 04/21/2009   Past Surgical History  Procedure Laterality Date  . Vesicovaginal fistula closure w/ tah      with BSO-'82  . Bunionectomy      with hammertoe correction - '93  .  Abdominal hysterectomy     History   Social History  . Marital Status: Divorced    Spouse Name: N/A    Number of Children: 2  . Years of Education: college   Occupational History  . opthal tech- retired    Social History Main Topics  . Smoking status: Current Every Day Smoker -- 1.00 packs/day for 50 years    Types: Cigarettes  . Smokeless tobacco: Never Used  . Alcohol Use: No     Comment: occasional glass of wine  . Drug Use: No   Social History Narrative   [employer], [employer] 2 year associates degree. Married  '61 -Divorced after 63 yrs marriage. 1 dtr - '66; 1 son '68,2 grandchildren.  Work - Tree surgeon - retired. ACP - CPR yes, yes for short term mechanical ventilation. No prolonged heroic measures. Provided Packet.         Current Outpatient Prescriptions on File Prior to Visit  Medication Sig Dispense Refill  . Cetirizine HCl (ZYRTEC PO) Take by mouth.      . clobetasol cream (TEMOVATE) 0.05 % Apply topically 2 (two) times daily.      . clorazepate (TRANXENE-T) 7.5 MG tablet TAKE 1/2 TABLET AT BEDTIME AND 1/2 TABLET AS NEEDED.  60 tablet  5  . Nutritional Supplements (BOOST PLUS PO) Take by mouth.      Marland Kitchen PREMARIN 0.3 MG tablet TAKE 1 TABLET ONCE DAILY. TAKE DAILY FOR 21 DAYS THEN DONT TAKE FOR 7DAYS.  30 tablet  11  . VECTICAL 3 MCG/GM cream        No current facility-administered medications on file prior to visit.   Allergies  Allergen Reactions  . Tetracycline     REACTION: Yeast infection   Family History  Problem Relation Age of Onset  . Arthritis Mother   . Heart disease Mother   . Hypertension Mother   . Heart attack Paternal Grandfather   . Coronary artery disease Other     paternal kinship  . Diabetes Other     Paternal kinship  . Cancer Neg Hx     colon  . Diabetes Brother   . Heart disease Brother     PAD  . Aneurysm Brother   . Aneurysm Father    PE: BP 126/74  Pulse 95  Temp(Src) 98.3 F (36.8 C) (Oral)  Resp 12  Ht 5\' 7"  (1.702 m)  Wt 126 lb (57.153 kg)  BMI 19.73 kg/m2  SpO2 94% Wt Readings from Last 3 Encounters:  04/28/14 126 lb (57.153 kg)  04/14/14 125 lb 12 oz (57.04 kg)  09/01/13 128 lb 4 oz (58.174 kg)   Constitutional: very thin, in NAD.+ kyphosis. Eyes: PERRLA, EOMI, no exophthalmos ENT: moist mucous membranes, no thyromegaly, no cervical lymphadenopathy Cardiovascular: RRR, No MRG Respiratory: CTA B Gastrointestinal: abdomen soft, NT, ND, BS+ Musculoskeletal: no deformities, strength intact in all 4 Skin: moist, warm, + rash - plantar (pustular psoriasis) Neurological: no tremor with  outstretched hands, DTR normal in all 4  Assessment: 1. Osteoporosis  Plan: 1. 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  - We reviewed her DEXA scans together, and I explained that based on the T scores, she has an increased risk for fractures. We cannot compare the T scores checked  in 2011 [facility]) with the previous DEXA scores as they are not checked on the same machine, but at least there is no decrease. - We discussed about the different medication classes. I would suggest an IV bisphosphonate (zoledronic acid = Reclast) or sq denosumab (Prolia). I don't think she is a candidate for Teriparatide at the moment. We discussed about expected benefits, but pt is adamant not to start any of these. She is afraid of side effects, as she tells me that she reacts to many of the medicines. We did not even get to discuss about class-related SEs like ONJ or atypical fxs. - we reviewed her dietary and supplemental calcium and vitamin D intake. I advised her to continue this - given her specific instructions about food sources for these - see pt instructions. We will also check a vit D level now - discussed fall precautions  - given handout from [place] Re: weight bearing exercises - advised to do this every day or at least 5/7 days - we discussed about the recommended daily protein intake is ~0.8 g per kilogram per day. I advised her to try to aim for this amount, since a diet low in proteins can exacerbate osteoporosis. Also, avoid >2 drinks of alcohol a day. Smoking does not have a clear connection with decreased BMD, but she made it clear she would not want to stop (smokes ~1 PPD) - will check a new DEXA scan and if decreases further, I would strongly encourage treatment,  although she is not keen to this. She is open to increase the Estrace to 0.6 mg but I am not sure this will have a large impact on her OP, but may increase her cardio-vascular risk. - will see pt back in a year  Pt's Drs: - Dr Thompson Grayer (cardiology) - Dr Silvano Rusk (GI) - Dr Rolm Bookbinder (derm) - Dr Rosana Hoes (ophth) - Dr Vicie Mutters (ENT) - Dr Mertha Baars (Dentist) - Dr Tish Frederickson (neuro)  CC: PCP only  - time spent with the patient: 1 hour, of which >50% was spent in obtaining information about her osteoporosis history, reviewing her previous labs, DEXA scans (along with the pt), evaluations, and treatments, counseling her about her condition (please see the discussed topics above), and developing a plan to further investigate it. She had a number of questions which I addressed.  Office Visit on 04/28/2014  Component Date Value Ref Range Status  . VITD 04/28/2014 48.13   Final   Deficient = <20Insufficient = 20 to <30Sufficient = 30 -100Upper Safety Limit = >100   Msg sent: Dear Ms Damron, Your vitamin D is normal, please continue the Multivitamin that you are using now. Sincerely, Philemon Kingdom MD

## 2014-04-29 ENCOUNTER — Telehealth: Payer: Self-pay | Admitting: *Deleted

## 2014-04-29 ENCOUNTER — Encounter: Payer: Self-pay | Admitting: Internal Medicine

## 2014-04-29 ENCOUNTER — Ambulatory Visit (INDEPENDENT_AMBULATORY_CARE_PROVIDER_SITE_OTHER): Payer: Medicare Other | Admitting: Internal Medicine

## 2014-04-29 VITALS — BP 140/80 | HR 100 | Temp 98.2°F | Wt 125.8 lb

## 2014-04-29 DIAGNOSIS — R21 Rash and other nonspecific skin eruption: Secondary | ICD-10-CM

## 2014-04-29 NOTE — Progress Notes (Addendum)
   Subjective:    Patient ID: [name], female    DOB: [birth year], 73 y.o.   MRN: [id number]  Rash Associated symptoms include fatigue. Pertinent negatives include no congestion, cough, diarrhea, fever, shortness of breath, sore throat or vomiting.  HPI Patient had pneumonia vaccine (pneumococcal conjugate 13) in her left arm on 04/14/14. Became swollen, erythematous, and warm later that day. It has stayed the same since then. She reports having low grade fevers, but no recorded temperatures from home. Today her temperature was 98.2 degrees F. She has had no reactions to vaccinations in the past. She describes it as achy. Has not tried anything. She states it looks better today. It is approx. 6 cm in length x 5 cm width.    Review of Systems  Constitutional: Positive for fatigue. Negative for fever, chills, diaphoresis, activity change, appetite change and unexpected weight change.  HENT: Negative for congestion, ear pain, facial swelling, hearing loss, mouth sores, sore throat, tinnitus, trouble swallowing and voice change.   Respiratory: Negative for apnea, cough, chest tightness, shortness of breath and wheezing.   Cardiovascular: Negative for chest pain, palpitations and leg swelling.  Gastrointestinal: Negative for nausea, vomiting, abdominal pain, diarrhea, blood in stool and anal bleeding.  Musculoskeletal: Negative for arthralgias, back pain, gait problem, joint swelling, neck pain and neck stiffness.  Skin: Positive for color change. Negative for rash.  Neurological: Negative for syncope, weakness, light-headedness, numbness and headaches.     Objective:   Physical Exam  BP retake 122/78 Pulse 92      Assessment & Plan:

## 2014-04-29 NOTE — Telephone Encounter (Signed)
DEXA scan scheduled for Monday, July 6th at 9:00 am at the [facility]Radiology. Called pt and advised her of the date and time. Be advised.

## 2014-04-29 NOTE — Progress Notes (Signed)
Pre visit review using our clinic review tool, if applicable. No additional management support is needed unless otherwise documented below in the visit note. 

## 2014-04-29 NOTE — Progress Notes (Signed)
   Subjective:    Patient ID: [name], female    DOB: May 22, 1941, 73 y.o.   MRN: [id number]  HPI  She had the pneumonia vaccine in the left deltoid area 04/14/14. She subsequently had swelling, erythema and increased temperature in that area. There is no associated pruritus. She also describes low-grade fevers. She did not bring in those records.  She has no past history of reactions with  influenza or pneumonia pneumococcal vaccine.  She's had some mild aching in this area. She has not applied any topical agents.   PMH of psoriasis with pustules as per Dr Rolm Bookbinder, Derm. Review of Systems  No associated itchy, watery eyes.  Swelling of the lips or tongue denied.  Shortness of breath, wheezing, or cough absent.  No rash or urticaria noted.  Fever ,chills , or sweats denied. Purulence absent.  Diarrhea not present.     Objective:   Physical Exam  Significant or distinguishing  findings on physical exam are documented first.  Below that are other systems examined & findings.    She is thin but adequately nourished There is 5.5 x 5 cm faint erythema in an irregular pattern over the left deltoid. There is no raised border. Slight petechial character suggested to some of the erythema. It is nonblanching. This may be slightly warmer than the center on the left. Breath sounds are markedly decreased. Slight clubbing suggested  Eyes: No corneal or conjunctival inflammation noted.     Ears: External  ear exam reveals no significant lesions or deformities. Canals clear .TMs normal. Hearing is grossly normal bilaterally. Nose: External nasal exam reveals no deformity or inflammation. Nasal mucosa are pink and moist. No lesions or exudates noted.   Mouth: Oral mucosa and oropharynx reveal no lesions or exudates. Teeth in good repair. Neck: No deformities, masses, or tenderness noted.  Lungs: No increased work of breathing. Heart: Normal rate and rhythm. Normal S1 and S2.  No gallop, click, or rub. S4 w/o murmur. Musculoskeletal/extremities:  No cyanosis, edema, or significant extremity  deformity noted. Range of motion normal .Tone & strength normal. Hand joints normal.   Neurologic: Alert and oriented x3.  Gait normal    Lymph: No cervical, axillary lymphadenopathy present. Psych: Mood and affect are normal. Normally interactive                                                                                        Assessment & Plan:  #1 localized reaction to pneumococcal vaccine. There is no evidence of cellulitis. This may represent minor injury to the vessels in this area based on the petechial type changes  #2 COPD; she continues to smoke. Risk See after visit summary

## 2014-04-29 NOTE — Patient Instructions (Signed)
Use warm moist compresses 3-4  3 times a day to the  area. Please report warning signs as we discussed. Worrisome would be pus formation, fever & red streaks going up arm.

## 2014-05-03 ENCOUNTER — Ambulatory Visit: Admission: RE | Admit: 2014-05-03 | Payer: Medicare Other | Source: Ambulatory Visit

## 2014-05-03 ENCOUNTER — Telehealth: Payer: Self-pay | Admitting: Internal Medicine

## 2014-05-03 NOTE — Telephone Encounter (Signed)
Relevant patient education assigned to patient using Emmi. ° °

## 2014-05-03 NOTE — Telephone Encounter (Signed)
Called pt and advised her per Dr Arman Filter note. We will schedule the new bone density for the end of Sept or first of Oct, because pt states she is going on vacation some time in Sept and she is unsure about the dates.

## 2014-05-03 NOTE — Telephone Encounter (Signed)
Please read note below and advise.  

## 2014-05-03 NOTE — Telephone Encounter (Signed)
It was done in 06/2012 >> IT IS >1 YEAR SINCE THIS, BUT WE CAN WAIT 3 MORE MONTHS AND THEN REPEAT. SHANNON, PLEASE ARRANGE FOR HERO HAVE IT DONE IN sEPT

## 2014-05-03 NOTE — Telephone Encounter (Signed)
Patient went to get her bone density and they did not complete due to the date of last one  Does she need to have this done?   Please advise patient   Thank You

## 2014-06-23 ENCOUNTER — Encounter: Payer: Self-pay | Admitting: *Deleted

## 2014-06-23 NOTE — Telephone Encounter (Signed)
Scheduled pt's bone density for Thurs, Oct 1st at 9:00 am. Called pt and lvm advising her of the appt date and time. Letter sent to pt as well.

## 2014-07-06 ENCOUNTER — Encounter: Payer: Self-pay | Admitting: Internal Medicine

## 2014-07-29 ENCOUNTER — Other Ambulatory Visit: Payer: Medicare Other

## 2014-07-29 ENCOUNTER — Ambulatory Visit (INDEPENDENT_AMBULATORY_CARE_PROVIDER_SITE_OTHER)
Admission: RE | Admit: 2014-07-29 | Discharge: 2014-07-29 | Disposition: A | Discharge status: Home / Self Care | Payer: Medicare Other | Source: Ambulatory Visit | Attending: Internal Medicine | Admitting: Internal Medicine

## 2014-07-29 DIAGNOSIS — M810 Age-related osteoporosis without current pathological fracture: Secondary | ICD-10-CM

## 2014-08-02 ENCOUNTER — Ambulatory Visit (HOSPITAL_COMMUNITY)
Admission: RE | Admit: 2014-08-02 | Discharge: 2014-08-02 | Disposition: A | Discharge status: Home / Self Care | Payer: Medicare Other | Source: Ambulatory Visit | Attending: Internal Medicine | Admitting: Internal Medicine

## 2014-08-02 DIAGNOSIS — Z1231 Encounter for screening mammogram for malignant neoplasm of breast: Secondary | ICD-10-CM | POA: Diagnosis not present

## 2014-08-03 LAB — HM MAMMOGRAPHY: HM Mammogram: NORMAL

## 2014-09-07 ENCOUNTER — Encounter: Payer: Self-pay | Admitting: Internal Medicine

## 2014-09-07 ENCOUNTER — Ambulatory Visit (INDEPENDENT_AMBULATORY_CARE_PROVIDER_SITE_OTHER): Payer: Medicare Other | Admitting: Internal Medicine

## 2014-09-07 VITALS — BP 118/78 | HR 99 | Temp 97.8°F | Resp 18 | Ht 69.0 in | Wt 128.1 lb

## 2014-09-07 DIAGNOSIS — R350 Frequency of micturition: Secondary | ICD-10-CM

## 2014-09-07 DIAGNOSIS — R109 Unspecified abdominal pain: Secondary | ICD-10-CM | POA: Insufficient documentation

## 2014-09-07 LAB — POCT URINALYSIS DIPSTICK
Bilirubin, UA: NEGATIVE
Blood, UA: NEGATIVE
Glucose, UA: NEGATIVE
KETONES UA: NEGATIVE
Leukocytes, UA: NEGATIVE
Nitrite, UA: NEGATIVE
PH UA: 6
PROTEIN UA: NEGATIVE
Urobilinogen, UA: 4

## 2014-09-07 NOTE — Assessment & Plan Note (Signed)
We'll check point-of-care urinalysis. If no signs of infection will proceed with ultrasound abdomen. Does not sound to be diverticulitis, appendicitis. Could possibly be muscle strain or sprain although patient denies recent activity that could've caused this. Will advise NSAIDs for pain relief as well as heat.

## 2014-09-07 NOTE — Patient Instructions (Signed)
You do not have any signs of a urine infection. We will check an ultrasound of your stomach to see if there are any problems with your kidneys, gallbladder, liver.  In the meantime you can continue to use ibuprofen for pain relief and may get benefit from a heating pad over the sore area.  We will call you back with the results of your ultrasound when we get the results.  Call our office back if you have any problems with vomiting, diarrhea, fevers, chills, worsening stomach pain.  Cholelithiasis Cholelithiasis (also called gallstones) is a form of gallbladder disease. The gallbladder is a small organ that helps you digest fats. Symptoms of gallstones are:  Feeling sick to your stomach (nausea).  Throwing up (vomiting).  Belly pain.  Yellowing of the skin (jaundice).  Sudden pain. You may feel the pain for minutes to hours.  Fever.  Pain to the touch. HOME CARE  Only take medicines as told by your doctor.  Eat a low-fat diet until you see your doctor again. Eating fat can result in pain.  Follow up with your doctor as told. Attacks usually happen time after time. Surgery is usually needed for permanent treatment. GET HELP RIGHT AWAY IF:   Your pain gets worse.  Your pain is not helped by medicines.  You have a fever and lasting symptoms for more than 2-3 days.  You have a fever and your symptoms suddenly get worse.  You keep feeling sick to your stomach and throwing up. MAKE SURE YOU:   Understand these instructions.  Will watch your condition.  Will get help right away if you are not doing well or get worse. Document Released: 04/02/2008 Document Revised: 06/17/2013 Document Reviewed: 04/08/2013 [facility] Patient Information 2015 [place], [place]. This information is not intended to replace advice given to you by your health care provider. Make sure you discuss any questions you have with your health care provider.

## 2014-09-07 NOTE — Progress Notes (Signed)
Pre visit review using our clinic review tool, if applicable. No additional management support is needed unless otherwise documented below in the visit note. 

## 2014-09-07 NOTE — Progress Notes (Signed)
   Subjective:    Patient ID: [name], female    DOB: 18-Mar-1941, 73 y.o.   MRN: [id number]  HPI The patient is a 73 year old female comes in today for an acute visit for right-sided pain. She noted it about 3 weeks ago at that time it was intermittent and not too worrisome. Since that time it has become more frequent and now is with her most of the day. She denies any dysuria although admits to increased frequency of urination. She does not know that there are any triggers and doesn't feel like it's been triggered by food or fatty foods. She denies nausea, vomiting, diarrhea, constipation.  Review of Systems  Constitutional: Negative for fever, chills, activity change, appetite change, fatigue and unexpected weight change.  Respiratory: Negative for cough, chest tightness, shortness of breath and wheezing.   Cardiovascular: Negative for chest pain, palpitations and leg swelling.  Gastrointestinal: Positive for abdominal pain. Negative for nausea, vomiting, diarrhea, constipation and abdominal distention.  Genitourinary: Positive for frequency and flank pain. Negative for dysuria, urgency, hematuria, enuresis and difficulty urinating.  Musculoskeletal: Positive for myalgias and back pain.  Neurological: Negative for dizziness and weakness.      Objective:   Physical Exam  Constitutional: She is oriented to person, place, and time. She appears well-developed and well-nourished. No distress.  HENT:  Head: Normocephalic and atraumatic.  Eyes: EOM are normal.  Neck: Normal range of motion.  Cardiovascular: Normal rate and regular rhythm.   Pulmonary/Chest: Effort normal and breath sounds normal.  Abdominal: Soft. Bowel sounds are normal. She exhibits no distension. There is tenderness. There is no rebound and no guarding.  Minimal tenderness to palpate in right upper quadrant as well as right flank.  Musculoskeletal: She exhibits tenderness.  Right flank with minimal tenderness    Neurological: She is alert and oriented to person, place, and time. Coordination normal.   Filed Vitals:   09/07/14 1452  BP: 118/78  Pulse: 99  Temp: 97.8 F (36.6 C)  TempSrc: Oral  Resp: 18  Height: 5\' 9"  (1.753 m)  Weight: 128 lb 1.9 oz (58.115 kg)  SpO2: 93%      Assessment & Plan:

## 2014-09-16 ENCOUNTER — Ambulatory Visit
Admission: RE | Admit: 2014-09-16 | Discharge: 2014-09-16 | Disposition: A | Discharge status: Home / Self Care | Payer: Medicare Other | Source: Ambulatory Visit | Attending: Internal Medicine | Admitting: Internal Medicine

## 2014-09-16 DIAGNOSIS — R109 Unspecified abdominal pain: Secondary | ICD-10-CM

## 2014-10-06 ENCOUNTER — Ambulatory Visit: Payer: Medicare Other | Admitting: Internal Medicine

## 2014-10-13 ENCOUNTER — Encounter: Payer: Self-pay | Admitting: Internal Medicine

## 2014-10-13 ENCOUNTER — Ambulatory Visit (INDEPENDENT_AMBULATORY_CARE_PROVIDER_SITE_OTHER): Payer: Medicare Other | Admitting: Internal Medicine

## 2014-10-13 VITALS — BP 120/82 | HR 84 | Temp 97.7°F | Resp 16 | Ht 69.0 in | Wt 127.0 lb

## 2014-10-13 DIAGNOSIS — J410 Simple chronic bronchitis: Secondary | ICD-10-CM

## 2014-10-13 NOTE — Patient Instructions (Signed)

## 2014-10-14 ENCOUNTER — Telehealth: Payer: Self-pay | Admitting: Internal Medicine

## 2014-10-14 NOTE — Progress Notes (Signed)
   Subjective:    Patient ID: [name], female    DOB: 07-18-1941, 73 y.o.   MRN: [id number]  Cough This is a chronic problem. The current episode started more than 1 year ago. The problem has been gradually improving. The problem occurs every few hours. The cough is non-productive. Associated symptoms include shortness of breath. Pertinent negatives include no chest pain, chills, ear pain, fever, headaches, heartburn, hemoptysis, myalgias, nasal congestion, postnasal drip, rash, rhinorrhea, sore throat, sweats, weight loss or wheezing. She has tried nothing for the symptoms. The treatment provided mild relief. Her past medical history is significant for COPD.      Review of Systems  Constitutional: Negative.  Negative for fever, chills, weight loss, diaphoresis and fatigue.  HENT: Negative.  Negative for ear pain, postnasal drip, rhinorrhea and sore throat.   Eyes: Negative.   Respiratory: Positive for cough and shortness of breath. Negative for hemoptysis and wheezing.   Cardiovascular: Negative.  Negative for chest pain, palpitations and leg swelling.  Gastrointestinal: Negative.  Negative for heartburn, nausea, vomiting, abdominal pain, diarrhea, constipation and blood in stool.  Endocrine: Negative.   Genitourinary: Negative.   Musculoskeletal: Negative.  Negative for myalgias, back pain and arthralgias.  Skin: Negative.  Negative for rash.  Allergic/Immunologic: Negative.   Neurological: Negative.  Negative for headaches.  Hematological: Negative.   Psychiatric/Behavioral: Negative.        Objective:   Physical Exam  Constitutional: She is oriented to person, place, and time. She appears well-developed and well-nourished. No distress.  HENT:  Head: Normocephalic and atraumatic.  Mouth/Throat: No oropharyngeal exudate.  Eyes: Conjunctivae are normal. Right eye exhibits no discharge. Left eye exhibits no discharge. No scleral icterus.  Neck: Normal range of motion. Neck  supple. No JVD present. No tracheal deviation present. No thyromegaly present.  Cardiovascular: Normal rate, regular rhythm, normal heart sounds and intact distal pulses.  Exam reveals no gallop and no friction rub.   No murmur heard. Pulmonary/Chest: Effort normal and breath sounds normal. No stridor. No respiratory distress. She has no wheezes. She has no rales. She exhibits no tenderness.  Abdominal: Soft. Bowel sounds are normal. She exhibits no distension and no mass. There is no tenderness. There is no rebound and no guarding.  Musculoskeletal: Normal range of motion. She exhibits no edema or tenderness.  Lymphadenopathy:    She has no cervical adenopathy.  Neurological: She is oriented to person, place, and time.  Skin: Skin is warm and dry. No rash noted. She is not diaphoretic. No erythema. No pallor.  Vitals reviewed.    Lab Results  Component Value Date   WBC 5.3 04/14/2014   HGB 13.6 04/14/2014   HCT 41.2 04/14/2014   PLT 404.0* 04/14/2014   GLUCOSE 108* 04/14/2014   CHOL 156 04/14/2014   TRIG 68.0 04/14/2014   HDL 49.30 04/14/2014   LDLCALC 93 04/14/2014   ALT 8 04/14/2014   AST 15 04/14/2014   NA 135 04/14/2014   K 4.1 04/14/2014   CL 100 04/14/2014   CREATININE 0.9 04/14/2014   BUN 8 04/14/2014   CO2 27 04/14/2014   TSH 0.50 04/14/2014   INR 1.0 04/22/2009   HGBA1C 5.7 04/14/2014       Assessment & Plan:

## 2014-10-14 NOTE — Assessment & Plan Note (Signed)
She does not want to use any inhalers and she feels like her symptoms are well controlled She was asked to stop smoking

## 2014-10-14 NOTE — Telephone Encounter (Signed)
emmi emailed °

## 2014-11-25 ENCOUNTER — Telehealth: Payer: Self-pay | Admitting: Internal Medicine

## 2014-11-25 NOTE — Telephone Encounter (Signed)
Patient called stating her [employer] will no longer be covering her premarin that she takes for osteoporosis. She states she has taken several other medications in the past including the generic premarin and nothing else works for her. She is requesting that we write an exception for her so her insurance will pay for it. CB# [phone]

## 2014-11-30 NOTE — Telephone Encounter (Signed)
Information for PA has been submitted to [facility] for premarin. Per insurance, may take up to 72 hours to process.

## 2014-11-30 NOTE — Telephone Encounter (Signed)
Pt called again.  She would like a call back today.

## 2014-12-01 NOTE — Telephone Encounter (Signed)
Pt notified of process

## 2014-12-28 ENCOUNTER — Telehealth: Payer: Self-pay

## 2014-12-28 DIAGNOSIS — F411 Generalized anxiety disorder: Secondary | ICD-10-CM

## 2014-12-28 DIAGNOSIS — K589 Irritable bowel syndrome without diarrhea: Secondary | ICD-10-CM

## 2014-12-28 MED ORDER — CLORAZEPATE DIPOTASSIUM 7.5 MG PO TABS: ORAL_TABLET | ORAL | Status: DC

## 2014-12-28 NOTE — Telephone Encounter (Signed)
RX faxed to [facility]

## 2014-12-28 NOTE — Telephone Encounter (Signed)
Received refill request from [facility] request refills clorzepate . Thanks

## 2014-12-28 NOTE — Telephone Encounter (Signed)
done

## 2015-04-14 ENCOUNTER — Encounter: Payer: Self-pay | Admitting: Internal Medicine

## 2015-04-14 ENCOUNTER — Other Ambulatory Visit (INDEPENDENT_AMBULATORY_CARE_PROVIDER_SITE_OTHER): Payer: Medicare Other

## 2015-04-14 ENCOUNTER — Ambulatory Visit (INDEPENDENT_AMBULATORY_CARE_PROVIDER_SITE_OTHER): Payer: Medicare Other | Admitting: Internal Medicine

## 2015-04-14 VITALS — BP 132/82 | HR 98 | Temp 98.4°F | Resp 16 | Ht 69.0 in | Wt 126.2 lb

## 2015-04-14 DIAGNOSIS — Z0000 Encounter for general adult medical examination without abnormal findings: Secondary | ICD-10-CM | POA: Diagnosis not present

## 2015-04-14 DIAGNOSIS — R0609 Other forms of dyspnea: Secondary | ICD-10-CM | POA: Diagnosis not present

## 2015-04-14 DIAGNOSIS — R739 Hyperglycemia, unspecified: Secondary | ICD-10-CM

## 2015-04-14 DIAGNOSIS — D473 Essential (hemorrhagic) thrombocythemia: Secondary | ICD-10-CM

## 2015-04-14 DIAGNOSIS — K219 Gastro-esophageal reflux disease without esophagitis: Secondary | ICD-10-CM

## 2015-04-14 DIAGNOSIS — E785 Hyperlipidemia, unspecified: Secondary | ICD-10-CM

## 2015-04-14 DIAGNOSIS — I700 Atherosclerosis of aorta: Secondary | ICD-10-CM | POA: Insufficient documentation

## 2015-04-14 DIAGNOSIS — J410 Simple chronic bronchitis: Secondary | ICD-10-CM

## 2015-04-14 LAB — COMPREHENSIVE METABOLIC PANEL
ALK PHOS: 69 U/L (ref 39–117)
ALT: 7 U/L (ref 0–35)
AST: 12 U/L (ref 0–37)
Albumin: 4.2 g/dL (ref 3.5–5.2)
BUN: 11 mg/dL (ref 6–23)
CALCIUM: 9.8 mg/dL (ref 8.4–10.5)
CO2: 30 mEq/L (ref 19–32)
Chloride: 99 mEq/L (ref 96–112)
Creatinine, Ser: 0.83 mg/dL (ref 0.40–1.20)
GFR: 71.61 mL/min (ref >60.00)
Glucose, Bld: 113 mg/dL — ABNORMAL HIGH (ref 70–99)
POTASSIUM: 5.2 meq/L — AB (ref 3.5–5.1)
SODIUM: 134 meq/L — AB (ref 135–145)
TOTAL PROTEIN: 8.4 g/dL — AB (ref 6.0–8.3)
Total Bilirubin: 0.4 mg/dL (ref 0.2–1.2)

## 2015-04-14 LAB — CBC WITH DIFFERENTIAL/PLATELET
BASOS ABS: 0 10*3/uL (ref 0.0–0.1)
Basophils Relative: 0.6 % (ref 0.0–3.0)
EOS ABS: 0 10*3/uL (ref 0.0–0.7)
Eosinophils Relative: 0.6 % (ref 0.0–5.0)
HCT: 44.7 % (ref 36.0–46.0)
Hemoglobin: 14.8 g/dL (ref 12.0–15.0)
LYMPHS ABS: 1.7 10*3/uL (ref 0.7–4.0)
Lymphocytes Relative: 27.6 % (ref 12.0–46.0)
MCHC: 33.1 g/dL (ref 30.0–36.0)
MCV: 86.1 fl (ref 78.0–100.0)
MONOS PCT: 5.6 % (ref 3.0–12.0)
Monocytes Absolute: 0.3 10*3/uL (ref 0.1–1.0)
Neutro Abs: 4.1 10*3/uL (ref 1.4–7.7)
Neutrophils Relative %: 65.6 % (ref 43.0–77.0)
PLATELETS: 443 10*3/uL — AB (ref 150.0–400.0)
RBC: 5.19 Mil/uL — ABNORMAL HIGH (ref 3.87–5.11)
RDW: 14.3 % (ref 11.5–15.5)
WBC: 6.2 10*3/uL (ref 4.0–10.5)

## 2015-04-14 LAB — LIPID PANEL
CHOL/HDL RATIO: 3
Cholesterol: 156 mg/dL (ref 0–200)
HDL: 51.5 mg/dL (ref >39.00)
LDL Cholesterol: 92 mg/dL (ref 0–99)
NONHDL: 104.5
Triglycerides: 65 mg/dL (ref 0.0–149.0)
VLDL: 13 mg/dL (ref 0.0–40.0)

## 2015-04-14 LAB — HEMOGLOBIN A1C: HEMOGLOBIN A1C: 5.6 % (ref 4.6–6.5)

## 2015-04-14 LAB — TSH: TSH: 0.97 u[IU]/mL (ref 0.35–4.50)

## 2015-04-14 NOTE — Patient Instructions (Signed)
Preventive Care for Adults A healthy lifestyle and preventive care can promote health and wellness. Preventive health guidelines for women include the following key practices.  A routine yearly physical is a good way to check with your health care provider about your health and preventive screening. It is a chance to share any concerns and updates on your health and to receive a thorough exam.  Visit your dentist for a routine exam and preventive care every 6 months. Brush your teeth twice a day and floss once a day. Good oral hygiene prevents tooth decay and gum disease.  The frequency of eye exams is based on your age, health, family medical history, use of contact lenses, and other factors. Follow your health care provider's recommendations for frequency of eye exams.  Eat a healthy diet. Foods like vegetables, fruits, whole grains, low-fat dairy products, and lean protein foods contain the nutrients you need without too many calories. Decrease your intake of foods high in solid fats, added sugars, and salt. Eat the right amount of calories for you.Get information about a proper diet from your health care provider, if necessary.  Regular physical exercise is one of the most important things you can do for your health. Most adults should get at least 150 minutes of moderate-intensity exercise (any activity that increases your heart rate and causes you to sweat) each week. In addition, most adults need muscle-strengthening exercises on 2 or more days a week.  Maintain a healthy weight. The body mass index (BMI) is a screening tool to identify possible weight problems. It provides an estimate of body fat based on height and weight. Your health care provider can find your BMI and can help you achieve or maintain a healthy weight.For adults 20 years and older:  A BMI below 18.5 is considered underweight.  A BMI of 18.5 to 24.9 is normal.  A BMI of 25 to 29.9 is considered overweight.  A BMI of  30 and above is considered obese.  Maintain normal blood lipids and cholesterol levels by exercising and minimizing your intake of saturated fat. Eat a balanced diet with plenty of fruit and vegetables. Blood tests for lipids and cholesterol should begin at age 76 and be repeated every 5 years. If your lipid or cholesterol levels are high, you are over 50, or you are at high risk for heart disease, you may need your cholesterol levels checked more frequently.Ongoing high lipid and cholesterol levels should be treated with medicines if diet and exercise are not working.  If you smoke, find out from your health care provider how to quit. If you do not use tobacco, do not start.  Lung cancer screening is recommended for adults aged 22-80 years who are at high risk for developing lung cancer because of a history of smoking. A yearly low-dose CT scan of the lungs is recommended for people who have at least a 30-pack-year history of smoking and are a current smoker or have quit within the past 15 years. A pack year of smoking is smoking an average of 1 pack of cigarettes a day for 1 year (for example: 1 pack a day for 30 years or 2 packs a day for 15 years). Yearly screening should continue until the smoker has stopped smoking for at least 15 years. Yearly screening should be stopped for people who develop a health problem that would prevent them from having lung cancer treatment.  If you are pregnant, do not drink alcohol. If you are breastfeeding,  be very cautious about drinking alcohol. If you are not pregnant and choose to drink alcohol, do not have more than 1 drink per day. One drink is considered to be 12 ounces (355 mL) of beer, 5 ounces (148 mL) of wine, or 1.5 ounces (44 mL) of liquor.  Avoid use of street drugs. Do not share needles with anyone. Ask for help if you need support or instructions about stopping the use of drugs.  High blood pressure causes heart disease and increases the risk of  stroke. Your blood pressure should be checked at least every 1 to 2 years. Ongoing high blood pressure should be treated with medicines if weight loss and exercise do not work.  If you are 3-86 years old, ask your health care provider if you should take aspirin to prevent strokes.  Diabetes screening involves taking a blood sample to check your fasting blood sugar level. This should be done once every 3 years, after age 67, if you are within normal weight and without risk factors for diabetes. Testing should be considered at a younger age or be carried out more frequently if you are overweight and have at least 1 risk factor for diabetes.  Breast cancer screening is essential preventive care for women. You should practice "breast self-awareness." This means understanding the normal appearance and feel of your breasts and may include breast self-examination. Any changes detected, no matter how small, should be reported to a health care provider. Women in their 8s and 30s should have a clinical breast exam (CBE) by a health care provider as part of a regular health exam every 1 to 3 years. After age 70, women should have a CBE every year. Starting at age 25, women should consider having a mammogram (breast X-ray test) every year. Women who have a family history of breast cancer should talk to their health care provider about genetic screening. Women at a high risk of breast cancer should talk to their health care providers about having an MRI and a mammogram every year.  Breast cancer gene (BRCA)-related cancer risk assessment is recommended for women who have family members with BRCA-related cancers. BRCA-related cancers include breast, ovarian, tubal, and peritoneal cancers. Having family members with these cancers may be associated with an increased risk for harmful changes (mutations) in the breast cancer genes BRCA1 and BRCA2. Results of the assessment will determine the need for genetic counseling and  BRCA1 and BRCA2 testing.  Routine pelvic exams to screen for cancer are no longer recommended for nonpregnant women who are considered low risk for cancer of the pelvic organs (ovaries, uterus, and vagina) and who do not have symptoms. Ask your health care provider if a screening pelvic exam is right for you.  If you have had past treatment for cervical cancer or a condition that could lead to cancer, you need Pap tests and screening for cancer for at least 20 years after your treatment. If Pap tests have been discontinued, your risk factors (such as having a new sexual partner) need to be reassessed to determine if screening should be resumed. Some women have medical problems that increase the chance of getting cervical cancer. In these cases, your health care provider may recommend more frequent screening and Pap tests.  The HPV test is an additional test that may be used for cervical cancer screening. The HPV test looks for the virus that can cause the cell changes on the cervix. The cells collected during the Pap test can be  tested for HPV. The HPV test could be used to screen women aged 30 years and older, and should be used in women of any age who have unclear Pap test results. After the age of 30, women should have HPV testing at the same frequency as a Pap test.  Colorectal cancer can be detected and often prevented. Most routine colorectal cancer screening begins at the age of 50 years and continues through age 75 years. However, your health care provider may recommend screening at an earlier age if you have risk factors for colon cancer. On a yearly basis, your health care provider may provide home test kits to check for hidden blood in the stool. Use of a small camera at the end of a tube, to directly examine the colon (sigmoidoscopy or colonoscopy), can detect the earliest forms of colorectal cancer. Talk to your health care provider about this at age 50, when routine screening begins. Direct  exam of the colon should be repeated every 5-10 years through age 75 years, unless early forms of pre-cancerous polyps or small growths are found.  People who are at an increased risk for hepatitis B should be screened for this virus. You are considered at high risk for hepatitis B if:  You were born in a country where hepatitis B occurs often. Talk with your health care provider about which countries are considered high risk.  Your parents were born in a high-risk country and you have not received a shot to protect against hepatitis B (hepatitis B vaccine).  You have HIV or AIDS.  You use needles to inject street drugs.  You live with, or have sex with, someone who has hepatitis B.  You get hemodialysis treatment.  You take certain medicines for conditions like cancer, organ transplantation, and autoimmune conditions.  Hepatitis C blood testing is recommended for all people born from 1945 through 1965 and any individual with known risks for hepatitis C.  Practice safe sex. Use condoms and avoid high-risk sexual practices to reduce the spread of sexually transmitted infections (STIs). STIs include gonorrhea, chlamydia, syphilis, trichomonas, herpes, HPV, and human immunodeficiency virus (HIV). Herpes, HIV, and HPV are viral illnesses that have no cure. They can result in disability, cancer, and death.  You should be screened for sexually transmitted illnesses (STIs) including gonorrhea and chlamydia if:  You are sexually active and are younger than 24 years.  You are older than 24 years and your health care provider tells you that you are at risk for this type of infection.  Your sexual activity has changed since you were last screened and you are at an increased risk for chlamydia or gonorrhea. Ask your health care provider if you are at risk.  If you are at risk of being infected with HIV, it is recommended that you take a prescription medicine daily to prevent HIV infection. This is  called preexposure prophylaxis (PrEP). You are considered at risk if:  You are a heterosexual woman, are sexually active, and are at increased risk for HIV infection.  You take drugs by injection.  You are sexually active with a partner who has HIV.  Talk with your health care provider about whether you are at high risk of being infected with HIV. If you choose to begin PrEP, you should first be tested for HIV. You should then be tested every 3 months for as long as you are taking PrEP.  Osteoporosis is a disease in which the bones lose minerals and strength   with aging. This can result in serious bone fractures or breaks. The risk of osteoporosis can be identified using a bone density scan. Women ages 65 years and over and women at risk for fractures or osteoporosis should discuss screening with their health care providers. Ask your health care provider whether you should take a calcium supplement or vitamin D to reduce the rate of osteoporosis.  Menopause can be associated with physical symptoms and risks. Hormone replacement therapy is available to decrease symptoms and risks. You should talk to your health care provider about whether hormone replacement therapy is right for you.  Use sunscreen. Apply sunscreen liberally and repeatedly throughout the day. You should seek shade when your shadow is shorter than you. Protect yourself by wearing long sleeves, pants, a wide-brimmed hat, and sunglasses year round, whenever you are outdoors.  Once a month, do a whole body skin exam, using a mirror to look at the skin on your back. Tell your health care provider of new moles, moles that have irregular borders, moles that are larger than a pencil eraser, or moles that have changed in shape or color.  Stay current with required vaccines (immunizations).  Influenza vaccine. All adults should be immunized every year.  Tetanus, diphtheria, and acellular pertussis (Td, Tdap) vaccine. Pregnant women should  receive 1 dose of Tdap vaccine during each pregnancy. The dose should be obtained regardless of the length of time since the last dose. Immunization is preferred during the 27th-36th week of gestation. An adult who has not previously received Tdap or who does not know her vaccine status should receive 1 dose of Tdap. This initial dose should be followed by tetanus and diphtheria toxoids (Td) booster doses every 10 years. Adults with an unknown or incomplete history of completing a 3-dose immunization series with Td-containing vaccines should begin or complete a primary immunization series including a Tdap dose. Adults should receive a Td booster every 10 years.  Varicella vaccine. An adult without evidence of immunity to varicella should receive 2 doses or a second dose if she has previously received 1 dose. Pregnant females who do not have evidence of immunity should receive the first dose after pregnancy. This first dose should be obtained before leaving the health care facility. The second dose should be obtained 4-8 weeks after the first dose.  Human papillomavirus (HPV) vaccine. Females aged 13-26 years who have not received the vaccine previously should obtain the 3-dose series. The vaccine is not recommended for use in pregnant females. However, pregnancy testing is not needed before receiving a dose. If a female is found to be pregnant after receiving a dose, no treatment is needed. In that case, the remaining doses should be delayed until after the pregnancy. Immunization is recommended for any person with an immunocompromised condition through the age of 26 years if she did not get any or all doses earlier. During the 3-dose series, the second dose should be obtained 4-8 weeks after the first dose. The third dose should be obtained 24 weeks after the first dose and 16 weeks after the second dose.  Zoster vaccine. One dose is recommended for adults aged 60 years or older unless certain conditions are  present.  Measles, mumps, and rubella (MMR) vaccine. Adults born before 1957 generally are considered immune to measles and mumps. Adults born in 1957 or later should have 1 or more doses of MMR vaccine unless there is a contraindication to the vaccine or there is laboratory evidence of immunity to   each of the three diseases. A routine second dose of MMR vaccine should be obtained at least 28 days after the first dose for students attending postsecondary schools, health care workers, or international travelers. People who received inactivated measles vaccine or an unknown type of measles vaccine during 1963-1967 should receive 2 doses of MMR vaccine. People who received inactivated mumps vaccine or an unknown type of mumps vaccine before 1979 and are at high risk for mumps infection should consider immunization with 2 doses of MMR vaccine. For females of childbearing age, rubella immunity should be determined. If there is no evidence of immunity, females who are not pregnant should be vaccinated. If there is no evidence of immunity, females who are pregnant should delay immunization until after pregnancy. Unvaccinated health care workers born before 1957 who lack laboratory evidence of measles, mumps, or rubella immunity or laboratory confirmation of disease should consider measles and mumps immunization with 2 doses of MMR vaccine or rubella immunization with 1 dose of MMR vaccine.  Pneumococcal 13-valent conjugate (PCV13) vaccine. When indicated, a person who is uncertain of her immunization history and has no record of immunization should receive the PCV13 vaccine. An adult aged 19 years or older who has certain medical conditions and has not been previously immunized should receive 1 dose of PCV13 vaccine. This PCV13 should be followed with a dose of pneumococcal polysaccharide (PPSV23) vaccine. The PPSV23 vaccine dose should be obtained at least 8 weeks after the dose of PCV13 vaccine. An adult aged 19  years or older who has certain medical conditions and previously received 1 or more doses of PPSV23 vaccine should receive 1 dose of PCV13. The PCV13 vaccine dose should be obtained 1 or more years after the last PPSV23 vaccine dose.  Pneumococcal polysaccharide (PPSV23) vaccine. When PCV13 is also indicated, PCV13 should be obtained first. All adults aged 65 years and older should be immunized. An adult younger than age 65 years who has certain medical conditions should be immunized. Any person who resides in a nursing home or long-term care facility should be immunized. An adult smoker should be immunized. People with an immunocompromised condition and certain other conditions should receive both PCV13 and PPSV23 vaccines. People with human immunodeficiency virus (HIV) infection should be immunized as soon as possible after diagnosis. Immunization during chemotherapy or radiation therapy should be avoided. Routine use of PPSV23 vaccine is not recommended for American Indians, [place] Natives, or people younger than 65 years unless there are medical conditions that require PPSV23 vaccine. When indicated, people who have unknown immunization and have no record of immunization should receive PPSV23 vaccine. One-time revaccination 5 years after the first dose of PPSV23 is recommended for people aged 19-64 years who have chronic kidney failure, nephrotic syndrome, asplenia, or immunocompromised conditions. People who received 1-2 doses of PPSV23 before age 65 years should receive another dose of PPSV23 vaccine at age 65 years or later if at least 5 years have passed since the previous dose. Doses of PPSV23 are not needed for people immunized with PPSV23 at or after age 65 years.  Meningococcal vaccine. Adults with asplenia or persistent complement component deficiencies should receive 2 doses of quadrivalent meningococcal conjugate (MenACWY-D) vaccine. The doses should be obtained at least 2 months apart.  Microbiologists working with certain meningococcal bacteria, military recruits, people at risk during an outbreak, and people who travel to or live in countries with a high rate of meningitis should be immunized. A first-year college student up through age   21 years who is living in a residence hall should receive a dose if she did not receive a dose on or after her 16th birthday. Adults who have certain high-risk conditions should receive one or more doses of vaccine.  Hepatitis A vaccine. Adults who wish to be protected from this disease, have certain high-risk conditions, work with hepatitis A-infected animals, work in hepatitis A research labs, or travel to or work in countries with a high rate of hepatitis A should be immunized. Adults who were previously unvaccinated and who anticipate close contact with an international adoptee during the first 60 days after arrival in the [place] from a country with a high rate of hepatitis A should be immunized.  Hepatitis B vaccine. Adults who wish to be protected from this disease, have certain high-risk conditions, may be exposed to blood or other infectious body fluids, are household contacts or sex partners of hepatitis B positive people, are clients or workers in certain care facilities, or travel to or work in countries with a high rate of hepatitis B should be immunized.  Haemophilus influenzae type b (Hib) vaccine. A previously unvaccinated person with asplenia or sickle cell disease or having a scheduled splenectomy should receive 1 dose of Hib vaccine. Regardless of previous immunization, a recipient of a hematopoietic stem cell transplant should receive a 3-dose series 6-12 months after her successful transplant. Hib vaccine is not recommended for adults with HIV infection. Preventive Services / Frequency Ages 64 to 68 years  Blood pressure check.** / Every 1 to 2 years.  Lipid and cholesterol check.** / Every 5 years beginning at age  22.  Clinical breast exam.** / Every 3 years for women in their 88s and 53s.  BRCA-related cancer risk assessment.** / For women who have family members with a BRCA-related cancer (breast, ovarian, tubal, or peritoneal cancers).  Pap test.** / Every 2 years from ages 90 through 51. Every 3 years starting at age 21 through age 56 or 3 with a history of 3 consecutive normal Pap tests.  HPV screening.** / Every 3 years from ages 24 through ages 1 to 46 with a history of 3 consecutive normal Pap tests.  Hepatitis C blood test.** / For any individual with known risks for hepatitis C.  Skin self-exam. / Monthly.  Influenza vaccine. / Every year.  Tetanus, diphtheria, and acellular pertussis (Tdap, Td) vaccine.** / Consult your health care provider. Pregnant women should receive 1 dose of Tdap vaccine during each pregnancy. 1 dose of Td every 10 years.  Varicella vaccine.** / Consult your health care provider. Pregnant females who do not have evidence of immunity should receive the first dose after pregnancy.  HPV vaccine. / 3 doses over 6 months, if 72 and younger. The vaccine is not recommended for use in pregnant females. However, pregnancy testing is not needed before receiving a dose.  Measles, mumps, rubella (MMR) vaccine.** / You need at least 1 dose of MMR if you were born in 1957 or later. You may also need a 2nd dose. For females of childbearing age, rubella immunity should be determined. If there is no evidence of immunity, females who are not pregnant should be vaccinated. If there is no evidence of immunity, females who are pregnant should delay immunization until after pregnancy.  Pneumococcal 13-valent conjugate (PCV13) vaccine.** / Consult your health care provider.  Pneumococcal polysaccharide (PPSV23) vaccine.** / 1 to 2 doses if you smoke cigarettes or if you have certain conditions.  Meningococcal vaccine.** /  1 dose if you are age 19 to 21 years and a first-year college  student living in a residence hall, or have one of several medical conditions, you need to get vaccinated against meningococcal disease. You may also need additional booster doses.  Hepatitis A vaccine.** / Consult your health care provider.  Hepatitis B vaccine.** / Consult your health care provider.  Haemophilus influenzae type b (Hib) vaccine.** / Consult your health care provider. Ages 40 to 64 years  Blood pressure check.** / Every 1 to 2 years.  Lipid and cholesterol check.** / Every 5 years beginning at age 20 years.  Lung cancer screening. / Every year if you are aged 55-80 years and have a 30-pack-year history of smoking and currently smoke or have quit within the past 15 years. Yearly screening is stopped once you have quit smoking for at least 15 years or develop a health problem that would prevent you from having lung cancer treatment.  Clinical breast exam.** / Every year after age 40 years.  BRCA-related cancer risk assessment.** / For women who have family members with a BRCA-related cancer (breast, ovarian, tubal, or peritoneal cancers).  Mammogram.** / Every year beginning at age 40 years and continuing for as long as you are in good health. Consult with your health care provider.  Pap test.** / Every 3 years starting at age 30 years through age 65 or 70 years with a history of 3 consecutive normal Pap tests.  HPV screening.** / Every 3 years from ages 30 years through ages 65 to 70 years with a history of 3 consecutive normal Pap tests.  Fecal occult blood test (FOBT) of stool. / Every year beginning at age 50 years and continuing until age 75 years. You may not need to do this test if you get a colonoscopy every 10 years.  Flexible sigmoidoscopy or colonoscopy.** / Every 5 years for a flexible sigmoidoscopy or every 10 years for a colonoscopy beginning at age 50 years and continuing until age 75 years.  Hepatitis C blood test.** / For all people born from 1945 through  1965 and any individual with known risks for hepatitis C.  Skin self-exam. / Monthly.  Influenza vaccine. / Every year.  Tetanus, diphtheria, and acellular pertussis (Tdap/Td) vaccine.** / Consult your health care provider. Pregnant women should receive 1 dose of Tdap vaccine during each pregnancy. 1 dose of Td every 10 years.  Varicella vaccine.** / Consult your health care provider. Pregnant females who do not have evidence of immunity should receive the first dose after pregnancy.  Zoster vaccine.** / 1 dose for adults aged 60 years or older.  Measles, mumps, rubella (MMR) vaccine.** / You need at least 1 dose of MMR if you were born in 1957 or later. You may also need a 2nd dose. For females of childbearing age, rubella immunity should be determined. If there is no evidence of immunity, females who are not pregnant should be vaccinated. If there is no evidence of immunity, females who are pregnant should delay immunization until after pregnancy.  Pneumococcal 13-valent conjugate (PCV13) vaccine.** / Consult your health care provider.  Pneumococcal polysaccharide (PPSV23) vaccine.** / 1 to 2 doses if you smoke cigarettes or if you have certain conditions.  Meningococcal vaccine.** / Consult your health care provider.  Hepatitis A vaccine.** / Consult your health care provider.  Hepatitis B vaccine.** / Consult your health care provider.  Haemophilus influenzae type b (Hib) vaccine.** / Consult your health care provider. Ages 65   years and over  Blood pressure check.** / Every 1 to 2 years.  Lipid and cholesterol check.** / Every 5 years beginning at age 22 years.  Lung cancer screening. / Every year if you are aged 73-80 years and have a 30-pack-year history of smoking and currently smoke or have quit within the past 15 years. Yearly screening is stopped once you have quit smoking for at least 15 years or develop a health problem that would prevent you from having lung cancer  treatment.  Clinical breast exam.** / Every year after age 4 years.  BRCA-related cancer risk assessment.** / For women who have family members with a BRCA-related cancer (breast, ovarian, tubal, or peritoneal cancers).  Mammogram.** / Every year beginning at age 40 years and continuing for as long as you are in good health. Consult with your health care provider.  Pap test.** / Every 3 years starting at age 9 years through age 34 or 91 years with 3 consecutive normal Pap tests. Testing can be stopped between 65 and 70 years with 3 consecutive normal Pap tests and no abnormal Pap or HPV tests in the past 10 years.  HPV screening.** / Every 3 years from ages 57 years through ages 64 or 45 years with a history of 3 consecutive normal Pap tests. Testing can be stopped between 65 and 70 years with 3 consecutive normal Pap tests and no abnormal Pap or HPV tests in the past 10 years.  Fecal occult blood test (FOBT) of stool. / Every year beginning at age 15 years and continuing until age 17 years. You may not need to do this test if you get a colonoscopy every 10 years.  Flexible sigmoidoscopy or colonoscopy.** / Every 5 years for a flexible sigmoidoscopy or every 10 years for a colonoscopy beginning at age 86 years and continuing until age 71 years.  Hepatitis C blood test.** / For all people born from [birth year] through 1964 and any individual with known risks for hepatitis C.  Osteoporosis screening.** / A one-time screening for women ages 83 years and over and women at risk for fractures or osteoporosis.  Skin self-exam. / Monthly.  Influenza vaccine. / Every year.  Tetanus, diphtheria, and acellular pertussis (Tdap/Td) vaccine.** / 1 dose of Td every 10 years.  Varicella vaccine.** / Consult your health care provider.  Zoster vaccine.** / 1 dose for adults aged 61 years or older.  Pneumococcal 13-valent conjugate (PCV13) vaccine.** / Consult your health care provider.  Pneumococcal  polysaccharide (PPSV23) vaccine.** / 1 dose for all adults aged 28 years and older.  Meningococcal vaccine.** / Consult your health care provider.  Hepatitis A vaccine.** / Consult your health care provider.  Hepatitis B vaccine.** / Consult your health care provider.  Haemophilus influenzae type b (Hib) vaccine.** / Consult your health care provider. ** Family history and personal history of risk and conditions may change your health care provider's recommendations. Document Released: 12/11/2001 Document Revised: 03/01/2014 Document Reviewed: 03/12/2011 [facility] Patient Information 2015 [place], [place]. This information is not intended to replace advice given to you by your health care provider. Make sure you discuss any questions you have with your health care provider.

## 2015-04-14 NOTE — Progress Notes (Signed)
Subjective:  Patient ID: [name], female    DOB: [birth year]  Age: 74 y.o. MRN: [id number]  CC: Annual Exam and Shortness of Breath   HPI [name] presents for a CPX but she also reports that SOB and DOE has been a little worse over the last 8 months, she also has a chronic NP cough and continues to smoke.  Outpatient Prescriptions Prior to Visit  Medication Sig Dispense Refill  . Cetirizine HCl (ZYRTEC PO) Take by mouth.    . clobetasol cream (TEMOVATE) 0.05 % Apply topically 2 (two) times daily.    . clorazepate (TRANXENE-T) 7.5 MG tablet TAKE 1/2 TABLET AT BEDTIME AND 1/2 TABLET AS NEEDED. 60 tablet 5  . Nutritional Supplements (BOOST PLUS PO) Take by mouth.    Marland Kitchen PREMARIN 0.3 MG tablet TAKE 1 TABLET ONCE DAILY. TAKE DAILY FOR 21 DAYS THEN DONT TAKE FOR 7DAYS. 30 tablet 11  . VECTICAL 3 MCG/GM cream      No facility-administered medications prior to visit.    ROS Review of Systems  Constitutional: Negative.  Negative for fever, chills, diaphoresis, activity change, appetite change, fatigue and unexpected weight change.  HENT: Negative.  Negative for trouble swallowing and voice change.   Eyes: Negative.   Respiratory: Positive for cough and shortness of breath. Negative for apnea, choking, chest tightness, wheezing and stridor.   Cardiovascular: Negative.  Negative for chest pain, palpitations and leg swelling.  Gastrointestinal: Negative.  Negative for abdominal pain.  Endocrine: Negative.   Genitourinary: Negative.   Musculoskeletal: Negative.  Negative for myalgias, back pain, arthralgias and neck pain.  Skin: Negative.  Negative for rash.  Allergic/Immunologic: Negative.   Neurological: Negative.  Negative for dizziness, facial asymmetry, speech difficulty, weakness, light-headedness and numbness.  Hematological: Negative.  Negative for adenopathy. Does not bruise/bleed easily.  Psychiatric/Behavioral: Negative for suicidal ideas, hallucinations, behavioral  problems, confusion, sleep disturbance, self-injury, dysphoric mood, decreased concentration and agitation. The patient is nervous/anxious. The patient is not hyperactive.     Objective:  BP 132/82 mmHg  Pulse 98  Temp(Src) 98.4 F (36.9 C) (Oral)  Ht 5\' 9"  (1.753 m)  Wt 126 lb 4 oz (57.267 kg)  BMI 18.64 kg/m2  SpO2 94%  BP Readings from Last 3 Encounters:  04/14/15 132/82  10/13/14 120/82  09/07/14 118/78    Wt Readings from Last 3 Encounters:  04/14/15 126 lb 4 oz (57.267 kg)  10/13/14 127 lb (57.607 kg)  09/07/14 128 lb 1.9 oz (58.115 kg)    Physical Exam  Constitutional: She is oriented to person, place, and time. She appears well-developed and well-nourished. No distress.  HENT:  Head: Normocephalic and atraumatic.  Mouth/Throat: Oropharynx is clear and moist. No oropharyngeal exudate.  Eyes: Conjunctivae are normal. Right eye exhibits no discharge. Left eye exhibits no discharge. No scleral icterus.  Neck: Normal range of motion. Neck supple. No JVD present. No tracheal deviation present. No thyromegaly present.  Cardiovascular: Normal rate, regular rhythm, normal heart sounds and intact distal pulses.  Exam reveals no gallop and no friction rub.   No murmur heard. EKG today  Sinus  Rhythm  -RSR(V1) -nondiagnostic.   PROBABLY NORMAL   Pulmonary/Chest: Effort normal and breath sounds normal. No stridor. No respiratory distress. She has no wheezes. She has no rales. She exhibits no tenderness.  Abdominal: Soft. Bowel sounds are normal. She exhibits no distension and no mass. There is no tenderness. There is no rebound and no guarding.  Musculoskeletal: Normal  range of motion. She exhibits no edema or tenderness.  Lymphadenopathy:    She has no cervical adenopathy.  Neurological: She is oriented to person, place, and time.  Skin: Skin is warm and dry. No rash noted. She is not diaphoretic. No erythema. No pallor.  Psychiatric: She has a normal mood and affect. Her  behavior is normal. Judgment and thought content normal.  Vitals reviewed.   Lab Results  Component Value Date   WBC 6.2 04/14/2015   HGB 14.8 04/14/2015   HCT 44.7 04/14/2015   PLT 443.0* 04/14/2015   GLUCOSE 113* 04/14/2015   CHOL 156 04/14/2015   TRIG 65.0 04/14/2015   HDL 51.50 04/14/2015   LDLCALC 92 04/14/2015   ALT 7 04/14/2015   AST 12 04/14/2015   NA 134* 04/14/2015   K 5.2* 04/14/2015   CL 99 04/14/2015   CREATININE 0.83 04/14/2015   BUN 11 04/14/2015   CO2 30 04/14/2015   TSH 0.97 04/14/2015   INR 1.0 04/22/2009   HGBA1C 5.6 04/14/2015    US Abdomen Complete  09/16/2014   CLINICAL DATA:  ruq and flank pain  EXAM: ULTRASOUND ABDOMEN COMPLETE  COMPARISON:  None.  FINDINGS: Gallbladder: No gallstones or wall thickening visualized. No sonographic Murphy sign noted.  Common bile duct: Diameter: 5.1 mm  Liver: No focal lesion identified. Within normal limits in parenchymal echogenicity.  IVC: No abnormality visualized.  Pancreas: Visualized portion unremarkable.  Spleen: Size and appearance within normal limits.  Right Kidney: Length: 10.6 cm. Echogenicity within normal limits. No hydronephrosis visualized. There is a simple appearing lower pole cyst measuring 1.7 cm in greatest dimension.  Left Kidney: Length: 11.4 cm. Echogenicity within normal limits. No mass or hydronephrosis visualized.  Abdominal aorta: No aneurysm visualized.  Other findings: No ascites is demonstrated.  IMPRESSION: 1. There is no acute hepatobiliary abnormality. 2. There is a simple appearing lower pole cyst in the right kidney. There is no hydronephrosis nor evidence of calcified stones.   Electronically Signed   By: David  Martinique   On: 09/16/2014 14:12    Assessment & Plan:   [name] was seen today for annual exam and shortness of breath.  Diagnoses and all orders for this visit:  Essential thrombocytosis - this is stable, will follow for now Orders: -     CBC with Differential/Platelet;  Future  Hyperlipidemia with target LDL less than 130 - she has achieved her LDL goal Orders: -     Lipid panel; Future -     Comprehensive metabolic panel; Future -     TSH; Future  Hyperglycemia - she has prediabetes Orders: -     Hemoglobin A1c; Future  Gastroesophageal reflux disease without esophagitis Orders: -     CBC with Differential/Platelet; Future  Atherosclerosis of aorta - FLP today  DOE (dyspnea on exertion) - Her EKG is WNL, will check her PFT's and I have asked her to have a f/up with cardiology soon Orders: -     Ambulatory referral to Cardiology -     Pulmonary Function Test; Future -     EKG 12-Lead  Simple chronic bronchitis - I have asked her to have an updated set of PFT's done, for now she is not willing to treat this Orders: -     Pulmonary Function Test; Future   I am having [name] maintain her clobetasol cream, Nutritional Supplements (BOOST PLUS PO), VECTICAL, PREMARIN, Cetirizine HCl (ZYRTEC PO), clorazepate, and acitretin.  Meds ordered this encounter  Medications  . acitretin (SORIATANE) 10 MG capsule    Sig:     Refill:  0   See AVS for instructions about healthy living and anticipatory guidance.  Follow-up: Return in about 3 months (around 07/15/2015).  Scarlette Calico, MD

## 2015-04-14 NOTE — Progress Notes (Signed)
Pre visit review using our clinic review tool, if applicable. No additional management support is needed unless otherwise documented below in the visit note. 

## 2015-04-17 ENCOUNTER — Encounter: Payer: Self-pay | Admitting: Internal Medicine

## 2015-04-17 NOTE — Assessment & Plan Note (Signed)

## 2015-04-19 ENCOUNTER — Ambulatory Visit (INDEPENDENT_AMBULATORY_CARE_PROVIDER_SITE_OTHER): Payer: Medicare Other | Admitting: Internal Medicine

## 2015-04-19 DIAGNOSIS — R0609 Other forms of dyspnea: Secondary | ICD-10-CM | POA: Diagnosis not present

## 2015-04-19 DIAGNOSIS — J410 Simple chronic bronchitis: Secondary | ICD-10-CM

## 2015-04-19 LAB — PULMONARY FUNCTION TEST
DL/VA % pred: 57 %
DL/VA: 3.02 ml/min/mmHg/L
DLCO unc % pred: 38 %
DLCO unc: 11.46 ml/min/mmHg
FEF 25-75 POST: 0.7 L/s
FEF 25-75 PRE: 0.6 L/s
FEF2575-%Change-Post: 17 %
FEF2575-%Pred-Post: 35 %
FEF2575-%Pred-Pre: 29 %
FEV1-%Change-Post: 4 %
FEV1-%PRED-POST: 53 %
FEV1-%Pred-Pre: 51 %
FEV1-Post: 1.38 L
FEV1-Pre: 1.31 L
FEV1FVC-%Change-Post: 4 %
FEV1FVC-%Pred-Pre: 78 %
FEV6-%Change-Post: 3 %
FEV6-%Pred-Post: 69 %
FEV6-%Pred-Pre: 67 %
FEV6-POST: 2.25 L
FEV6-Pre: 2.18 L
FEV6FVC-%Change-Post: 2 %
FEV6FVC-%Pred-Post: 104 %
FEV6FVC-%Pred-Pre: 101 %
FVC-%Change-Post: 0 %
FVC-%PRED-PRE: 65 %
FVC-%Pred-Post: 66 %
FVC-Post: 2.25 L
FVC-Pre: 2.23 L
PRE FEV6/FVC RATIO: 98 %
Post FEV1/FVC ratio: 61 %
Post FEV6/FVC ratio: 100 %
Pre FEV1/FVC ratio: 59 %
RV % pred: 117 %
RV: 2.89 L
TLC % pred: 89 %
TLC: 5.06 L

## 2015-04-19 NOTE — Progress Notes (Signed)
PFT done today. 

## 2015-04-20 ENCOUNTER — Encounter: Payer: Self-pay | Admitting: Internal Medicine

## 2015-04-20 NOTE — Addendum Note (Signed)
Addended by: Janith Lima on: 04/20/2015 07:37 AM   Modules accepted: Orders, SmartSet

## 2015-04-25 ENCOUNTER — Ambulatory Visit (INDEPENDENT_AMBULATORY_CARE_PROVIDER_SITE_OTHER)
Admission: RE | Admit: 2015-04-25 | Discharge: 2015-04-25 | Disposition: A | Discharge status: Home / Self Care | Payer: Medicare Other | Source: Ambulatory Visit | Attending: Internal Medicine | Admitting: Internal Medicine

## 2015-04-25 ENCOUNTER — Ambulatory Visit (INDEPENDENT_AMBULATORY_CARE_PROVIDER_SITE_OTHER): Payer: Medicare Other | Admitting: Internal Medicine

## 2015-04-25 ENCOUNTER — Encounter: Payer: Self-pay | Admitting: Internal Medicine

## 2015-04-25 VITALS — BP 112/70 | HR 81 | Ht 69.0 in | Wt 126.4 lb

## 2015-04-25 DIAGNOSIS — J449 Chronic obstructive pulmonary disease, unspecified: Secondary | ICD-10-CM

## 2015-04-25 DIAGNOSIS — Z720 Tobacco use: Secondary | ICD-10-CM | POA: Diagnosis not present

## 2015-04-25 DIAGNOSIS — F17210 Nicotine dependence, cigarettes, uncomplicated: Secondary | ICD-10-CM

## 2015-04-25 NOTE — Progress Notes (Signed)
   Subjective:    Patient ID: [name], female    DOB: 01-29-1941,  MRN: [id number]  HPI  69 yowf active smoker with first onset of doe Sept 2015 referred by Dr Ronnald Ramp to pulmonary clinic 04/25/2015 with GOLD II copd criteria 04/19/15    04/25/2015 1st [facility] office visit/ Walid Haig   Chief Complaint  Patient presents with  . Pulmonary Consult    Referred by Dr. Scarlette Calico.  Pt c/o SOB for the past 9 months. She states she notices SOB "in hot weather" and walking up an incline.    ok flat at nl pace but can't do hills/steps well x 9 m  MMRC =1   No obvious day to day or daytime variabilty or assoc chronic cough or cp or chest tightness, subjective wheeze overt sinus or hb symptoms. No unusual exp hx or h/o childhood pna/ asthma or knowledge of premature birth.  Sleeping ok without nocturnal  or early am exacerbation  of respiratory  c/o's or need for noct saba. Also denies any obvious fluctuation of symptoms with weather or environmental changes or other aggravating or alleviating factors except as outlined above   Current Medications, Allergies, Complete Past Medical History, Past Surgical History, Family History, and Social History were reviewed in [employer] record.          Review of Systems  Constitutional: Negative for fever, chills and unexpected weight change.  HENT: Negative for congestion, dental problem, ear pain, nosebleeds, postnasal drip, rhinorrhea, sinus pressure, sneezing, sore throat, trouble swallowing and voice change.   Eyes: Negative for visual disturbance.  Respiratory: Positive for shortness of breath. Negative for cough and choking.   Cardiovascular: Negative for chest pain and leg swelling.  Gastrointestinal: Negative for vomiting, abdominal pain and diarrhea.  Genitourinary: Negative for difficulty urinating.  Musculoskeletal: Negative for arthralgias.  Skin: Negative for rash.  Neurological: Negative for tremors, syncope  and headaches.  Hematological: Does not bruise/bleed easily.       Objective:   Physical Exam  amb wf nad   Wt Readings from Last 3 Encounters:  04/25/15 126 lb 6.4 oz (57.335 kg)  04/14/15 126 lb 4 oz (57.267 kg)  10/13/14 127 lb (57.607 kg)    Vital signs reviewed   HEENT: nl dentition, turbinates, and orophanx. Nl external ear canals without cough reflex   NECK :  without JVD/Nodes/TM/ nl carotid upstrokes bilaterally   LUNGS: no acc muscle use,  Distant bs bilaterally s wheeze    CV:  RRR  no s3 or murmur or increase in P2, no edema   ABD:  soft and nontender with pos hoover's early insp  No bruits or organomegaly, bowel sounds nl  MS:  warm without deformities, calf tenderness, cyanosis or clubbing  SKIN: warm and dry without lesions    NEURO:  alert, approp, no deficits   CXR PA and Lateral:   04/25/2015 :     I personally reviewed images and agree with radiology impression as follows:    The lungs remain markedly hyperinflated with hemidiaphragm flattening and increased AP dimension of the thorax. There is no alveolar infiltrate. No pulmonary parenchymal nodules or masses are observed. There is no pleural effusion or pneumothorax. The heart and pulmonary vascularity are normal. 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            Assessment & Plan:

## 2015-04-25 NOTE — Progress Notes (Signed)
Quick Note:  Called and spoke with pt. Reviewed results and recs. Pt voiced understanding and had no further questions. ______ 

## 2015-04-25 NOTE — Patient Instructions (Addendum)
The key is to stop smoking completely before smoking completely stops you - it's not too late as you only have GOLD II copd   Please remember to go to the x-ray department downstairs for your tests - we will call you with the results when they are available.     Return if breathing worsens

## 2015-04-26 ENCOUNTER — Encounter: Payer: Self-pay | Admitting: Internal Medicine

## 2015-04-26 NOTE — Assessment & Plan Note (Signed)
  I took an extended  opportunity with this patient to outline the consequences of continued cigarette use  in airway disorders based on all the data we have from the multiple national lung health studies (perfomed over decades at millions of dollars in cost)  indicating that smoking cessation, not choice of inhalers or physicians, is the most important aspect of care.  (see copd)

## 2015-04-26 NOTE — Addendum Note (Signed)
Addended by: Christinia Gully B on: 04/26/2015 08:53 AM   Modules accepted: Orders

## 2015-04-26 NOTE — Assessment & Plan Note (Addendum)
-    PFTs  04/19/15 FEV1  1.38 (53%) ratio 61 and no change p saba and dlco 38 corrects to 57% - 04/25/2015  Walked RA x 3 laps @ 185 ft each stopped due to  End of study/ no sob or desats   She is a classic group B pattern GOLD II and still smoking but no AB features/ tendency to exac or need for saba though moderate copd clearly present. .  I had an extended discussion with the patient reviewing all relevant studies completed to date x 30m   1) reviewed the Fletcher curve with the patient that basically indicates  if you quit smoking when your best day FEV1 is still well preserved (as is relatively  the case here)  it is highly unlikely you will progress to severe disease and informed the patient there was no medication on the market that has proven to alter the curve/ its downward trajectory  or the likelihood of progression of their disease.  Therefore stopping smoking and maintaining abstinence is the most important aspect of care, not choice of inhalers or for that matter, doctors.    2) treatment is all symptom related and her symptoms at present do not really warrant any intervention other than smoking cessation sooner rather than later > deferred back to primary care for options to help her quit when she is ready   3) Ideally needs alpha one AT phenotype to see if she's a carrier but strongly doubt deficiency/ ZZ status   4) pulmonary f/u can be prn   Each maintenance medication was reviewed in detail including most importantly the difference between maintenance and prns and under what circumstances the prns are to be triggered using an action plan format that is not reflected in the computer generated alphabetically organized AVS.    Please see instructions for details which were reviewed in writing and the patient given a copy highlighting the part that I personally wrote and discussed at today's ov.

## 2015-05-07 ENCOUNTER — Other Ambulatory Visit: Payer: Self-pay | Admitting: Internal Medicine

## 2015-05-30 ENCOUNTER — Encounter: Payer: Medicare Other | Admitting: Internal Medicine

## 2015-05-30 ENCOUNTER — Ambulatory Visit: Payer: Medicare Other | Admitting: Internal Medicine

## 2015-06-29 ENCOUNTER — Ambulatory Visit (INDEPENDENT_AMBULATORY_CARE_PROVIDER_SITE_OTHER): Payer: Medicare Other | Admitting: Internal Medicine

## 2015-06-29 ENCOUNTER — Encounter: Payer: Self-pay | Admitting: Internal Medicine

## 2015-06-29 VITALS — BP 116/72 | HR 100 | Ht 68.0 in | Wt 126.8 lb

## 2015-06-29 DIAGNOSIS — J449 Chronic obstructive pulmonary disease, unspecified: Secondary | ICD-10-CM

## 2015-06-29 DIAGNOSIS — R0609 Other forms of dyspnea: Secondary | ICD-10-CM

## 2015-06-29 DIAGNOSIS — Z720 Tobacco use: Secondary | ICD-10-CM | POA: Diagnosis not present

## 2015-06-29 DIAGNOSIS — F17210 Nicotine dependence, cigarettes, uncomplicated: Secondary | ICD-10-CM

## 2015-06-29 NOTE — Patient Instructions (Addendum)
Medication Instructions:  No changes were made to your medications today  Labwork: N/A  Testing/Procedures:   Follow-Up: Your physician wants you to follow-up in: AS NEEDED for Dr. Rayann Heman.    Any Other Special Instructions Will Be Listed Below (If Applicable).     Medication Instructions:  Your physician recommends that you continue on your current medications as directed. Please refer to the Current Medication list given to you today.   Labwork: None ordered  Testing/Procedures: Your physician has requested that you have an echocardiogram. Echocardiography is a painless test that uses sound waves to create images of your heart. It provides your doctor with information about the size and shape of your heart and how well your heart's chambers and valves are working. This procedure takes approximately one hour. There are no restrictions for this procedure.  Your physician has requested that you have an exercise tolerance test. For further information please visit [email or web address]. Please also follow instruction sheet, as given.    Follow-Up: Your physician recommends that you schedule a follow-up appointment as needed   Any Other Special Instructions Will Be Listed Below (If Applicable).  Smoking Cessation Quitting smoking is important to your health and has many advantages. However, it is not always easy to quit since nicotine is a very addictive drug. Oftentimes, people try 3 times or more before being able to quit. This document explains the best ways for you to prepare to quit smoking. Quitting takes hard work and a lot of effort, but you can do it. ADVANTAGES OF QUITTING SMOKING  You will live longer, feel better, and live better.  Your body will feel the impact of quitting smoking almost immediately.  Within 20 minutes, blood pressure decreases. Your pulse returns to its normal level.  After 8 hours, carbon monoxide levels in the blood return to normal. Your  oxygen level increases.  After 24 hours, the chance of having a heart attack starts to decrease. Your breath, hair, and body stop smelling like smoke.  After 48 hours, damaged nerve endings begin to recover. Your sense of taste and smell improve.  After 72 hours, the body is virtually free of nicotine. Your bronchial tubes relax and breathing becomes easier.  After 2 to 12 weeks, lungs can hold more air. Exercise becomes easier and circulation improves.  The risk of having a heart attack, stroke, cancer, or lung disease is greatly reduced.  After 1 year, the risk of coronary heart disease is cut in half.  After 5 years, the risk of stroke falls to the same as a nonsmoker.  After 10 years, the risk of lung cancer is cut in half and the risk of other cancers decreases significantly.  After 15 years, the risk of coronary heart disease drops, usually to the level of a nonsmoker.  If you are pregnant, quitting smoking will improve your chances of having a healthy baby.  The people you live with, especially any children, will be healthier.  You will have extra money to spend on things other than cigarettes. QUESTIONS TO THINK ABOUT BEFORE ATTEMPTING TO QUIT You may want to talk about your answers with your health care provider.  Why do you want to quit?  If you tried to quit in the past, what helped and what did not?  What will be the most difficult situations for you after you quit? How will you plan to handle them?  Who can help you through the tough times? Your family? Friends? A health care provider?  What pleasures do you get from smoking? What ways can you still get pleasure if you quit? Here are some questions to ask your health care provider:  How can you help me to be successful at quitting?  What medicine do you think would be best for me and how should I take it?  What should I do if I need more help?  What is smoking withdrawal like? How can I get information on  withdrawal? GET READY  Set a quit date.  Change your environment by getting rid of all cigarettes, ashtrays, matches, and lighters in your home, car, or work. Do not let people smoke in your home.  Review your past attempts to quit. Think about what worked and what did not. GET SUPPORT AND ENCOURAGEMENT You have a better chance of being successful if you have help. You can get support in many ways.  Tell your family, friends, and coworkers that you are going to quit and need their support. Ask them not to smoke around you.  Get individual, group, or telephone counseling and support. Programs are available at [employer] and health centers. Call your local health department for information about programs in your area.  Spiritual beliefs and practices may help some smokers quit.  Download a "quit meter" on your computer to keep track of quit statistics, such as how long you have gone without smoking, cigarettes not smoked, and money saved.  Get a self-help book about quitting smoking and staying off tobacco. Burnside yourself from urges to smoke. Talk to someone, go for a walk, or occupy your time with a task.  Change your normal routine. Take a different route to work. Drink tea instead of coffee. Eat breakfast in a different place.  Reduce your stress. Take a hot bath, exercise, or read a book.  Plan something enjoyable to do every day. Reward yourself for not smoking.  Explore interactive web-based programs that specialize in helping you quit. GET MEDICINE AND USE IT CORRECTLY Medicines can help you stop smoking and decrease the urge to smoke. Combining medicine with the above behavioral methods and support can greatly increase your chances of successfully quitting smoking.  Nicotine replacement therapy helps deliver nicotine to your body without the negative effects and risks of smoking. Nicotine replacement therapy includes nicotine gum,  lozenges, inhalers, nasal sprays, and skin patches. Some may be available over-the-counter and others require a prescription.  Antidepressant medicine helps people abstain from smoking, but how this works is unknown. This medicine is available by prescription.  Nicotinic receptor partial agonist medicine simulates the effect of nicotine in your brain. This medicine is available by prescription. Ask your health care provider for advice about which medicines to use and how to use them based on your health history. Your health care provider will tell you what side effects to look out for if you choose to be on a medicine or therapy. Carefully read the information on the package. Do not use any other product containing nicotine while using a nicotine replacement product.  RELAPSE OR DIFFICULT SITUATIONS Most relapses occur within the first 3 months after quitting. Do not be discouraged if you start smoking again. Remember, most people try several times before finally quitting. You may have symptoms of withdrawal because your body is used to nicotine. You may crave cigarettes, be irritable, feel very hungry, cough often, get headaches, or have difficulty concentrating. The withdrawal symptoms are only temporary. They are strongest when  you first quit, but they will go away within 10-14 days. To reduce the chances of relapse, try to:  Avoid drinking alcohol. Drinking lowers your chances of successfully quitting.  Reduce the amount of caffeine you consume. Once you quit smoking, the amount of caffeine in your body increases and can give you symptoms, such as a rapid heartbeat, sweating, and anxiety.  Avoid smokers because they can make you want to smoke.  Do not let weight gain distract you. Many smokers will gain weight when they quit, usually less than 10 pounds. Eat a healthy diet and stay active. You can always lose the weight gained after you quit.  Find ways to improve your mood other than  smoking. FOR MORE INFORMATION  www.smokefree.gov  Document Released: 10/09/2001 Document Revised: 03/01/2014 Document Reviewed: 01/24/2012 [facility] Patient Information 2015 [place], [place]. This information is not intended to replace advice given to you by your health care provider. Make sure you discuss any questions you have with your health care provider.

## 2015-06-29 NOTE — Progress Notes (Signed)
Electrophysiology Office Note   Date:  06/29/2015   ID:  Emma, Bryan 1941/01/19, MRN [id number]  PCP:  Emma Calico, MD    Chief Complaint  Patient presents with  . Shortness of Breath     History of Present Illness: [name] is a 74 y.o. female who presents today for electrophysiology evaluation.   She was seen by me in 2010.  She has syncope, felt to be due to dehydration at that time.  Due to family history of aortic aneurysm and concerns, I ordered an MRI. This revealed no aneurysm.  She also had a subsequent [place] which was normal. Unfortunately, she continues to smoke 1 PPD.  She has developed SOB and has been referred to Dr [name] who has diagnosed COPD.    Today, she denies symptoms of palpitations, chest pain,  orthopnea, PND, lower extremity edema, claudication, dizziness, presyncope, syncope, bleeding, or neurologic sequela. The patient is tolerating medications without difficulties and is otherwise without complaint today.  + psoriasis and easy bruising   Past Medical History  Diagnosis Date  . Ruptured eardrum     childhood  . Hemorrhoids, internal   . Hx: UTI (urinary tract infection)   . Necrobiosis lipoidica   . GERD (gastroesophageal reflux disease)   . Osteoarthritis   . Osteoporosis   . Anxiety   . ECZEMA, HX OF 07/25/2007    psoriasis  . Irritable bowel syndrome 08/18/2009  . SYNCOPE 04/21/2009    without recurrence   Past Surgical History  Procedure Laterality Date  . Vesicovaginal fistula closure w/ tah      with BSO-'82  . Bunionectomy      with hammertoe correction - '93  . Abdominal hysterectomy       Current Outpatient Prescriptions  Medication Sig Dispense Refill  . Cetirizine HCl (ZYRTEC PO) Take 1 tablet by mouth daily.     . clobetasol cream (TEMOVATE) 1.44 % Apply 1 application topically 2 (two) times daily as needed (feet).     . clorazepate (TRANXENE) 7.5 MG tablet Take 1/2 tablet by mouth at bedtime and take 1/2 tablet  by mouth daily as needed for anxiety    . Multiple Vitamin (MULTIVITAMIN) tablet Take 1 tablet by mouth daily.    . Nutritional Supplements (BOOST PLUS PO) Take 1 Can by mouth daily.     Marland Kitchen PREMARIN 0.3 MG tablet TAKE 1 TABLET ONCE DAILY. TAKE DAILY FOR 21 DAYS THEN DONT TAKE FOR 7DAYS. 23 tablet 5  . VECTICAL 3 MCG/GM cream Apply 1 application topically daily.      No current facility-administered medications for this visit.    Allergies:   Pneumococcal vaccines and Tetracycline   Social History:  The patient  reports that she has been smoking Cigarettes.  She has a 50 pack-year smoking history. She has never used smokeless tobacco. She reports that she does not drink alcohol or use illicit drugs.   Family History:  The patient's  family history includes Aneurysm in her brother and father; Aneurysm (age of onset: 49) in her daughter; Arthritis in her mother; Coronary artery disease in her other; Diabetes in her brother and other; Heart attack in her paternal grandfather; Heart disease in her brother and mother; Hypertension in her mother. There is no history of Cancer.    ROS:  Please see the history of present illness.   All other systems are reviewed and negative.    PHYSICAL EXAM: VS:  BP 116/72 mmHg  Pulse 100  Ht 5\' 8"  (1.727 m)  Wt 57.516 kg (126 lb 12.8 oz)  BMI 19.28 kg/m2 , BMI Body mass index is 19.28 kg/(m^2). GEN: Well nourished, well developed, in no acute distresssmells heavily of tobacco HEENT: normal Neck: no JVD, carotid bruits, or masses Cardiac: RRR; no murmurs, rubs, or gallops,no edema  Respiratory:  clear to auscultation bilaterally, normal work of breathing GI: soft, nontender, nondistended, + BS MS: no deformity or atrophy Skin: warm and dry, quite wrinkled Neuro:  Strength and sensation are intact Psych: euthymic mood, full affect  EKG:  EKG is ordered today. The ekg ordered today shows sinus rhythm 100 bpm, PR 140, nonspecific ST/T changes   Recent  Labs: 04/14/2015: ALT 7; BUN 11; Creatinine, Ser 0.83; Hemoglobin 14.8; Platelets 443.0*; Potassium 5.2*; Sodium 134*; TSH 0.97    Lipid Panel     Component Value Date/Time   CHOL 156 04/14/2015 1123   TRIG 65.0 04/14/2015 1123   HDL 51.50 04/14/2015 1123   CHOLHDL 3 04/14/2015 1123   VLDL 13.0 04/14/2015 1123   LDLCALC 92 04/14/2015 1123     Wt Readings from Last 3 Encounters:  06/29/15 57.516 kg (126 lb 12.8 oz)  04/25/15 57.335 kg (126 lb 6.4 oz)  04/14/15 57.267 kg (126 lb 4 oz)      Other studies Reviewed: Additional studies/ records that were reviewed today include: primary care and Dr Morrison Old notes, MRI and [place]  Review of the above records today demonstrates: as above   ASSESSMENT AND PLAN:  1.  Shortness of breath Likely due to copd Smoking cessation strongly advised She has CAD risk factors of age and smoking Lipids/ a1c reviewed Will order ETT and also echo to evaluate for other etiologies of her SOB  2. Syncope/ palpitations- resolved  3. Tobacco Cessation discussed at length chantix offered She is not ready to quit  No further EP workup planned If abnormal, will refer to general cardiology  I will see as needed going forward   Current medicines are reviewed at length with the patient today.   The patient does not have concerns regarding her medicines.  The following changes were made today:  none   Signed, Emma Grayer, MD  06/29/2015 10:54 AM     [facility] [address] [place] [address] [phone] (office) [phone] (fax)

## 2015-07-25 ENCOUNTER — Ambulatory Visit (INDEPENDENT_AMBULATORY_CARE_PROVIDER_SITE_OTHER): Payer: Medicare Other

## 2015-07-25 ENCOUNTER — Other Ambulatory Visit: Payer: Self-pay

## 2015-07-25 ENCOUNTER — Encounter: Payer: Medicare Other | Admitting: Physician Assistant

## 2015-07-25 ENCOUNTER — Ambulatory Visit (HOSPITAL_COMMUNITY): Payer: Medicare Other | Attending: Cardiology

## 2015-07-25 DIAGNOSIS — R0609 Other forms of dyspnea: Secondary | ICD-10-CM

## 2015-07-25 DIAGNOSIS — I071 Rheumatic tricuspid insufficiency: Secondary | ICD-10-CM | POA: Diagnosis not present

## 2015-07-25 DIAGNOSIS — F17210 Nicotine dependence, cigarettes, uncomplicated: Secondary | ICD-10-CM

## 2015-07-25 DIAGNOSIS — Z720 Tobacco use: Secondary | ICD-10-CM

## 2015-07-25 DIAGNOSIS — R0600 Dyspnea, unspecified: Secondary | ICD-10-CM | POA: Diagnosis present

## 2015-07-25 LAB — EXERCISE TOLERANCE TEST
CHL CUP MPHR: 147 {beats}/min
CHL CUP RESTING HR STRESS: 90 {beats}/min
CSEPED: 4 min
CSEPEW: 5.8 METS
Peak HR: 137 {beats}/min
Percent HR: 93 %
RPE: 15

## 2015-07-26 ENCOUNTER — Other Ambulatory Visit: Payer: Self-pay | Admitting: Internal Medicine

## 2015-09-13 ENCOUNTER — Ambulatory Visit (INDEPENDENT_AMBULATORY_CARE_PROVIDER_SITE_OTHER): Payer: Medicare Other | Admitting: Internal Medicine

## 2015-09-13 ENCOUNTER — Encounter: Payer: Self-pay | Admitting: Internal Medicine

## 2015-09-13 VITALS — BP 138/78 | HR 96 | Temp 98.4°F | Resp 16 | Ht 68.0 in | Wt 129.0 lb

## 2015-09-13 DIAGNOSIS — R739 Hyperglycemia, unspecified: Secondary | ICD-10-CM | POA: Diagnosis not present

## 2015-09-13 DIAGNOSIS — J449 Chronic obstructive pulmonary disease, unspecified: Secondary | ICD-10-CM

## 2015-09-13 DIAGNOSIS — D473 Essential (hemorrhagic) thrombocythemia: Secondary | ICD-10-CM

## 2015-09-13 MED ORDER — UMECLIDINIUM BROMIDE 62.5 MCG/INH IN AEPB: 1.0000 | INHALATION_SPRAY | Freq: Every day | RESPIRATORY_TRACT | Status: DC

## 2015-09-13 NOTE — Progress Notes (Signed)
Subjective:  Patient ID: [name], female    DOB: 06/19/1941  Age: 74 y.o. MRN: [id number]  CC: Cough   HPI SANIYA LAFONT presents for follow-up on intermittent episodes of cough and shortness of breath. She has had a thorough cardiovascular evaluation with an exercise treadmill test and echocardiogram. Those tests were normal and do not explain her shortness of breath and dyspnea on exertion. She continues to smoke tobacco and appears to have shortness of breath from chronic obstructive pulmonary disease. She does report occasional coughing that is really productive of a clear phlegm.  Outpatient Prescriptions Prior to Visit  Medication Sig Dispense Refill  . Cetirizine HCl (ZYRTEC PO) Take 1 tablet by mouth daily.     . clobetasol cream (TEMOVATE) [id number] % Apply 1 application topically 2 (two) times daily as needed (feet).     . clorazepate (TRANXENE) 7.5 MG tablet TAKE 1/2 TABLET AT BEDTIME AND 1/2 TABLET AS NEEDED. 60 tablet 2  . Multiple Vitamin (MULTIVITAMIN) tablet Take 1 tablet by mouth daily.    . Nutritional Supplements (BOOST PLUS PO) Take 1 Can by mouth daily.     Marland Kitchen PREMARIN 0.3 MG tablet TAKE 1 TABLET ONCE DAILY. TAKE DAILY FOR 21 DAYS THEN DONT TAKE FOR 7DAYS. 23 tablet 5  . VECTICAL 3 MCG/GM cream Apply 1 application topically daily.      No facility-administered medications prior to visit.    ROS Review of Systems  Constitutional: Negative.  Negative for fever, chills, diaphoresis, appetite change and fatigue.  HENT: Negative.   Eyes: Negative.   Respiratory: Positive for cough and shortness of breath. Negative for apnea, choking, chest tightness, wheezing and stridor.   Cardiovascular: Negative.  Negative for chest pain, palpitations and leg swelling.  Gastrointestinal: Negative.  Negative for nausea, vomiting, abdominal pain, diarrhea, constipation and blood in stool.  Endocrine: Negative.   Genitourinary: Negative.   Musculoskeletal: Negative.   Skin:  Negative.  Negative for rash.  Allergic/Immunologic: Negative.   Neurological: Negative.  Negative for dizziness and weakness.  Hematological: Negative.  Negative for adenopathy. Does not bruise/bleed easily.  Psychiatric/Behavioral: Negative.     Objective:  BP 138/78 mmHg  Pulse 96  Temp(Src) 98.4 F (36.9 C) (Oral)  Resp 16  Ht 5\' 8"  (1.727 m)  Wt 129 lb (58.514 kg)  BMI 19.62 kg/m2  SpO2 97%  BP Readings from Last 3 Encounters:  09/13/15 138/78  06/29/15 116/72  04/25/15 112/70    Wt Readings from Last 3 Encounters:  09/13/15 129 lb (58.514 kg)  06/29/15 126 lb 12.8 oz (57.516 kg)  04/25/15 126 lb 6.4 oz (57.335 kg)    Physical Exam  Constitutional: She is oriented to person, place, and time. No distress.  HENT:  Head: Normocephalic and atraumatic.  Mouth/Throat: Oropharynx is clear and moist. No oropharyngeal exudate.  Eyes: Conjunctivae are normal. Right eye exhibits no discharge. Left eye exhibits no discharge. No scleral icterus.  Neck: Normal range of motion. Neck supple. No JVD present. No tracheal deviation present. No thyromegaly present.  Cardiovascular: Normal rate, regular rhythm, normal heart sounds and intact distal pulses.  Exam reveals no gallop and no friction rub.   No murmur heard. Pulmonary/Chest: Effort normal and breath sounds normal. No stridor. No respiratory distress. She has no wheezes. She has no rales. She exhibits no tenderness.  Abdominal: Soft. Bowel sounds are normal. She exhibits no distension and no mass. There is no tenderness. There is no rebound and no guarding.  Musculoskeletal: Normal range of motion. She exhibits no edema or tenderness.  Lymphadenopathy:    She has no cervical adenopathy.  Neurological: She is oriented to person, place, and time.  Skin: Skin is warm and dry. No rash noted. She is not diaphoretic. No erythema. No pallor.  Vitals reviewed.   Lab Results  Component Value Date   WBC 6.2 04/14/2015   HGB 14.8  04/14/2015   HCT 44.7 04/14/2015   PLT 443.0* 04/14/2015   GLUCOSE 113* 04/14/2015   CHOL 156 04/14/2015   TRIG 65.0 04/14/2015   HDL 51.50 04/14/2015   LDLCALC 92 04/14/2015   ALT 7 04/14/2015   AST 12 04/14/2015   NA 134* 04/14/2015   K 5.2* 04/14/2015   CL 99 04/14/2015   CREATININE 0.83 04/14/2015   BUN 11 04/14/2015   CO2 30 04/14/2015   TSH 0.97 04/14/2015   INR 1.0 04/22/2009   HGBA1C 5.6 04/14/2015    Dg Chest 2 View  04/25/2015  CLINICAL DATA:  One month history of dyspnea on exertion; history of COPD and smoking EXAM: CHEST  2 VIEW COMPARISON:  Chest x-ray dated December 08, 2013 FINDINGS: The lungs remain markedly hyperinflated with hemidiaphragm flattening and increased AP dimension of the thorax. There is no alveolar infiltrate. No pulmonary parenchymal nodules or masses are observed. There is no pleural effusion or pneumothorax. The heart and pulmonary vascularity are normal. 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 IMPRESSION: COPD. There is no pneumonia nor other acute cardiopulmonary disease. Electronically Signed   By: David  Martinique M.D.   On: 04/25/2015 11:51    Assessment & Plan:   Windell was seen today for cough.  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)- -     Basic metabolic panel; Future  COPD GOLD II/ emphysematous features - I have asked her to try a LAMA agent to see if it will help with the shortness of breath and occasionally productive cough. Again, she was asked to stop smoking. She will contemplate this. -     Umeclidinium Bromide (INCRUSE ELLIPTA) 62.5 MCG/INH AEPB; Inhale 1 puff into the lungs daily.  I am having Ms. Brendel start on Umeclidinium Bromide. I am also having her maintain her clobetasol cream, Nutritional Supplements (BOOST PLUS PO), VECTICAL, Cetirizine HCl (ZYRTEC PO), PREMARIN, multivitamin, clorazepate, and triamcinolone ointment.  Meds ordered this encounter    Medications  . triamcinolone ointment (KENALOG) 0.1 %    Sig:     Refill:  0  . Umeclidinium Bromide (INCRUSE ELLIPTA) 62.5 MCG/INH AEPB    Sig: Inhale 1 puff into the lungs daily.    Dispense:  30 each    Refill:  11     Follow-up: Return in about 4 months (around 01/11/2016).  Scarlette Calico, MD

## 2015-09-13 NOTE — Patient Instructions (Signed)
Chronic Obstructive Pulmonary Disease Chronic obstructive pulmonary disease (COPD) is a common lung condition in which airflow from the lungs is limited. COPD is a general term that can be used to describe many different lung problems that limit airflow, including both chronic bronchitis and emphysema. If you have COPD, your lung function will probably never return to normal, but there are measures you can take to improve lung function and make yourself feel better. CAUSES   Smoking (common).  Exposure to secondhand smoke.  Genetic problems.  Chronic inflammatory lung diseases or recurrent infections. SYMPTOMS  Shortness of breath, especially with physical activity.  Deep, persistent (chronic) cough with a large amount of thick mucus.  Wheezing.  Rapid breaths (tachypnea).  Gray or bluish discoloration (cyanosis) of the skin, especially in your fingers, toes, or lips.  Fatigue.  Weight loss.  Frequent infections or episodes when breathing symptoms become much worse (exacerbations).  Chest tightness. DIAGNOSIS Your health care provider will take a medical history and perform a physical examination to diagnose COPD. Additional tests for COPD may include:  Lung (pulmonary) function tests.  Chest X-ray.  CT scan.  Blood tests. TREATMENT  Treatment for COPD may include:  Inhaler and nebulizer medicines. These help manage the symptoms of COPD and make your breathing more comfortable.  Supplemental oxygen. Supplemental oxygen is only helpful if you have a low oxygen level in your blood.  Exercise and physical activity. These are beneficial for nearly all people with COPD.  Lung surgery or transplant.  Nutrition therapy to gain weight, if you are underweight.  Pulmonary rehabilitation. This may involve working with a team of health care providers and specialists, such as respiratory, occupational, and physical therapists. HOME CARE INSTRUCTIONS  Take all medicines  (inhaled or pills) as directed by your health care provider.  Avoid over-the-counter medicines or cough syrups that dry up your airway (such as antihistamines) and slow down the elimination of secretions unless instructed otherwise by your health care provider.  If you are a smoker, the most important thing that you can do is stop smoking. Continuing to smoke will cause further lung damage and breathing trouble. Ask your health care provider for help with quitting smoking. He or she can direct you to community resources or hospitals that provide support.  Avoid exposure to irritants such as smoke, chemicals, and fumes that aggravate your breathing.  Use oxygen therapy and pulmonary rehabilitation if directed by your health care provider. If you require home oxygen therapy, ask your health care provider whether you should purchase a pulse oximeter to measure your oxygen level at home.  Avoid contact with individuals who have a contagious illness.  Avoid extreme temperature and humidity changes.  Eat healthy foods. Eating smaller, more frequent meals and resting before meals may help you maintain your strength.  Stay active, but balance activity with periods of rest. Exercise and physical activity will help you maintain your ability to do things you want to do.  Preventing infection and hospitalization is very important when you have COPD. Make sure to receive all the vaccines your health care provider recommends, especially the pneumococcal and influenza vaccines. Ask your health care provider whether you need a pneumonia vaccine.  Learn and use relaxation techniques to manage stress.  Learn and use controlled breathing techniques as directed by your health care provider. Controlled breathing techniques include:  Pursed lip breathing. Start by breathing in (inhaling) through your nose for 1 second. Then, purse your lips as if you were   going to whistle and breathe out (exhale) through the  pursed lips for 2 seconds.  Diaphragmatic breathing. Start by putting one hand on your abdomen just above your waist. Inhale slowly through your nose. The hand on your abdomen should move out. Then purse your lips and exhale slowly. You should be able to feel the hand on your abdomen moving in as you exhale.  Learn and use controlled coughing to clear mucus from your lungs. Controlled coughing is a series of short, progressive coughs. The steps of controlled coughing are: 1. Lean your head slightly forward. 2. Breathe in deeply using diaphragmatic breathing. 3. Try to hold your breath for 3 seconds. 4. Keep your mouth slightly open while coughing twice. 5. Spit any mucus out into a tissue. 6. Rest and repeat the steps once or twice as needed. SEEK MEDICAL CARE IF:  You are coughing up more mucus than usual.  There is a change in the color or thickness of your mucus.  Your breathing is more labored than usual.  Your breathing is faster than usual. SEEK IMMEDIATE MEDICAL CARE IF:  You have shortness of breath while you are resting.  You have shortness of breath that prevents you from:  Being able to talk.  Performing your usual physical activities.  You have chest pain lasting longer than 5 minutes.  Your skin color is more cyanotic than usual.  You measure low oxygen saturations for longer than 5 minutes with a pulse oximeter. MAKE SURE YOU:  Understand these instructions.  Will watch your condition.  Will get help right away if you are not doing well or get worse.   This information is not intended to replace advice given to you by your health care provider. Make sure you discuss any questions you have with your health care provider.   Document Released: 07/25/2005 Document Revised: 11/05/2014 Document Reviewed: 06/11/2013 Elsevier Interactive Patient Education 2016 [employer]  

## 2015-10-20 ENCOUNTER — Other Ambulatory Visit: Payer: Self-pay | Admitting: Internal Medicine

## 2015-12-02 ENCOUNTER — Telehealth: Payer: Self-pay

## 2015-12-02 NOTE — Telephone Encounter (Signed)
PA form received for Premarin, completed and placed on MD's desk for signature

## 2015-12-05 NOTE — Telephone Encounter (Signed)
Signed and faxed back.

## 2015-12-08 NOTE — Telephone Encounter (Signed)
PA Approved 12/05/2015 - 12/04/2016

## 2016-01-11 ENCOUNTER — Encounter: Payer: Self-pay | Admitting: Internal Medicine

## 2016-01-11 ENCOUNTER — Other Ambulatory Visit (INDEPENDENT_AMBULATORY_CARE_PROVIDER_SITE_OTHER): Payer: Medicare Other

## 2016-01-11 ENCOUNTER — Ambulatory Visit (INDEPENDENT_AMBULATORY_CARE_PROVIDER_SITE_OTHER): Payer: Medicare Other | Admitting: Internal Medicine

## 2016-01-11 VITALS — BP 130/78 | HR 84 | Temp 97.9°F | Resp 16 | Ht 68.0 in | Wt 130.0 lb

## 2016-01-11 DIAGNOSIS — D473 Essential (hemorrhagic) thrombocythemia: Secondary | ICD-10-CM

## 2016-01-11 DIAGNOSIS — J449 Chronic obstructive pulmonary disease, unspecified: Secondary | ICD-10-CM

## 2016-01-11 DIAGNOSIS — R739 Hyperglycemia, unspecified: Secondary | ICD-10-CM

## 2016-01-11 DIAGNOSIS — I700 Atherosclerosis of aorta: Secondary | ICD-10-CM

## 2016-01-11 DIAGNOSIS — Z720 Tobacco use: Secondary | ICD-10-CM

## 2016-01-11 DIAGNOSIS — R918 Other nonspecific abnormal finding of lung field: Secondary | ICD-10-CM

## 2016-01-11 DIAGNOSIS — F17210 Nicotine dependence, cigarettes, uncomplicated: Secondary | ICD-10-CM

## 2016-01-11 LAB — CBC WITH DIFFERENTIAL/PLATELET
BASOS PCT: 0.8 % (ref 0.0–3.0)
Basophils Absolute: 0.1 10*3/uL (ref 0.0–0.1)
EOS PCT: 0.8 % (ref 0.0–5.0)
Eosinophils Absolute: 0.1 10*3/uL (ref 0.0–0.7)
HCT: 43.7 % (ref 36.0–46.0)
HEMOGLOBIN: 14.9 g/dL (ref 12.0–15.0)
LYMPHS ABS: 1.7 10*3/uL (ref 0.7–4.0)
Lymphocytes Relative: 25.7 % (ref 12.0–46.0)
MCHC: 34 g/dL (ref 30.0–36.0)
MCV: 85.7 fl (ref 78.0–100.0)
MONO ABS: 0.4 10*3/uL (ref 0.1–1.0)
MONOS PCT: 5.4 % (ref 3.0–12.0)
NEUTROS PCT: 67.3 % (ref 43.0–77.0)
Neutro Abs: 4.6 10*3/uL (ref 1.4–7.7)
Platelets: 403 10*3/uL — ABNORMAL HIGH (ref 150.0–400.0)
RBC: 5.1 Mil/uL (ref 3.87–5.11)
RDW: 14.1 % (ref 11.5–15.5)
WBC: 6.8 10*3/uL (ref 4.0–10.5)

## 2016-01-11 LAB — LIPID PANEL
CHOLESTEROL: 153 mg/dL (ref 0–200)
HDL: 49.8 mg/dL (ref >39.00)
LDL Cholesterol: 87 mg/dL (ref 0–99)
NonHDL: 103.18
TRIGLYCERIDES: 80 mg/dL (ref 0.0–149.0)
Total CHOL/HDL Ratio: 3
VLDL: 16 mg/dL (ref 0.0–40.0)

## 2016-01-11 LAB — HEMOGLOBIN A1C: Hgb A1c MFr Bld: 5.8 % (ref 4.6–6.5)

## 2016-01-11 LAB — BASIC METABOLIC PANEL
BUN: 8 mg/dL (ref 6–23)
CHLORIDE: 99 meq/L (ref 96–112)
CO2: 32 meq/L (ref 19–32)
Calcium: 9.5 mg/dL (ref 8.4–10.5)
Creatinine, Ser: 0.8 mg/dL (ref 0.40–1.20)
GFR: 74.57 mL/min (ref >60.00)
Glucose, Bld: 100 mg/dL — ABNORMAL HIGH (ref 70–99)
POTASSIUM: 4.3 meq/L (ref 3.5–5.1)
SODIUM: 136 meq/L (ref 135–145)

## 2016-01-11 MED ORDER — ASPIRIN EC 81 MG PO TBEC: 81.0000 mg | DELAYED_RELEASE_TABLET | Freq: Every day | ORAL | Status: DC

## 2016-01-11 NOTE — Patient Instructions (Signed)
Chronic Obstructive Pulmonary Disease Chronic obstructive pulmonary disease (COPD) is a common lung condition in which airflow from the lungs is limited. COPD is a general term that can be used to describe many different lung problems that limit airflow, including both chronic bronchitis and emphysema. If you have COPD, your lung function will probably never return to normal, but there are measures you can take to improve lung function and make yourself feel better. CAUSES   Smoking (common).  Exposure to secondhand smoke.  Genetic problems.  Chronic inflammatory lung diseases or recurrent infections. SYMPTOMS  Shortness of breath, especially with physical activity.  Deep, persistent (chronic) cough with a large amount of thick mucus.  Wheezing.  Rapid breaths (tachypnea).  Gray or bluish discoloration (cyanosis) of the skin, especially in your fingers, toes, or lips.  Fatigue.  Weight loss.  Frequent infections or episodes when breathing symptoms become much worse (exacerbations).  Chest tightness. DIAGNOSIS Your health care provider will take a medical history and perform a physical examination to diagnose COPD. Additional tests for COPD may include:  Lung (pulmonary) function tests.  Chest X-ray.  CT scan.  Blood tests. TREATMENT  Treatment for COPD may include:  Inhaler and nebulizer medicines. These help manage the symptoms of COPD and make your breathing more comfortable.  Supplemental oxygen. Supplemental oxygen is only helpful if you have a low oxygen level in your blood.  Exercise and physical activity. These are beneficial for nearly all people with COPD.  Lung surgery or transplant.  Nutrition therapy to gain weight, if you are underweight.  Pulmonary rehabilitation. This may involve working with a team of health care providers and specialists, such as respiratory, occupational, and physical therapists. HOME CARE INSTRUCTIONS  Take all medicines  (inhaled or pills) as directed by your health care provider.  Avoid over-the-counter medicines or cough syrups that dry up your airway (such as antihistamines) and slow down the elimination of secretions unless instructed otherwise by your health care provider.  If you are a smoker, the most important thing that you can do is stop smoking. Continuing to smoke will cause further lung damage and breathing trouble. Ask your health care provider for help with quitting smoking. He or she can direct you to community resources or hospitals that provide support.  Avoid exposure to irritants such as smoke, chemicals, and fumes that aggravate your breathing.  Use oxygen therapy and pulmonary rehabilitation if directed by your health care provider. If you require home oxygen therapy, ask your health care provider whether you should purchase a pulse oximeter to measure your oxygen level at home.  Avoid contact with individuals who have a contagious illness.  Avoid extreme temperature and humidity changes.  Eat healthy foods. Eating smaller, more frequent meals and resting before meals may help you maintain your strength.  Stay active, but balance activity with periods of rest. Exercise and physical activity will help you maintain your ability to do things you want to do.  Preventing infection and hospitalization is very important when you have COPD. Make sure to receive all the vaccines your health care provider recommends, especially the pneumococcal and influenza vaccines. Ask your health care provider whether you need a pneumonia vaccine.  Learn and use relaxation techniques to manage stress.  Learn and use controlled breathing techniques as directed by your health care provider. Controlled breathing techniques include:  Pursed lip breathing. Start by breathing in (inhaling) through your nose for 1 second. Then, purse your lips as if you were   going to whistle and breathe out (exhale) through the  pursed lips for 2 seconds.  Diaphragmatic breathing. Start by putting one hand on your abdomen just above your waist. Inhale slowly through your nose. The hand on your abdomen should move out. Then purse your lips and exhale slowly. You should be able to feel the hand on your abdomen moving in as you exhale.  Learn and use controlled coughing to clear mucus from your lungs. Controlled coughing is a series of short, progressive coughs. The steps of controlled coughing are: 1. Lean your head slightly forward. 2. Breathe in deeply using diaphragmatic breathing. 3. Try to hold your breath for 3 seconds. 4. Keep your mouth slightly open while coughing twice. 5. Spit any mucus out into a tissue. 6. Rest and repeat the steps once or twice as needed. SEEK MEDICAL CARE IF:  You are coughing up more mucus than usual.  There is a change in the color or thickness of your mucus.  Your breathing is more labored than usual.  Your breathing is faster than usual. SEEK IMMEDIATE MEDICAL CARE IF:  You have shortness of breath while you are resting.  You have shortness of breath that prevents you from:  Being able to talk.  Performing your usual physical activities.  You have chest pain lasting longer than 5 minutes.  Your skin color is more cyanotic than usual.  You measure low oxygen saturations for longer than 5 minutes with a pulse oximeter. MAKE SURE YOU:  Understand these instructions.  Will watch your condition.  Will get help right away if you are not doing well or get worse.   This information is not intended to replace advice given to you by your health care provider. Make sure you discuss any questions you have with your health care provider.   Document Released: 07/25/2005 Document Revised: 11/05/2014 Document Reviewed: 06/11/2013 Elsevier Interactive Patient Education 2016 [employer]  

## 2016-01-11 NOTE — Progress Notes (Signed)
Pre visit review using our clinic review tool, if applicable. No additional management support is needed unless otherwise documented below in the visit note. 

## 2016-01-12 NOTE — Progress Notes (Signed)
Subjective:  Patient ID: [name], female    DOB: 1941/04/14  Age: 75 y.o. MRN: [id number]  CC: COPD   HPI SCOUT GUYETT presents for follow-up on COPD and high platelet count.  She denies any recent respiratory symptoms and doesn't think she needs to use the inhaler. She continues to smoke and does not have any motivation to stop smoking.  She wants to recheck her platelet count because she is concerned about it. She has no symptoms of platelet disorder such as bleeding or bruising. She has never had any complications such as heart attack or stroke.  Her anxiety is well-controlled with the current doses of clorazepate. She is not willing to take an antidepressant.  Outpatient Prescriptions Prior to Visit  Medication Sig Dispense Refill  . Cetirizine HCl (ZYRTEC PO) Take 1 tablet by mouth daily.     . clorazepate (TRANXENE) 7.5 MG tablet TAKE 1/2 TABLET AT BEDTIME AND 1/2 TABLET AS NEEDED. 60 tablet 2  . Multiple Vitamin (MULTIVITAMIN) tablet Take 1 tablet by mouth daily.    . Nutritional Supplements (BOOST PLUS PO) Take 1 Can by mouth daily.     Marland Kitchen PREMARIN 0.3 MG tablet TAKE 1 TABLET ONCE DAILY. TAKE DAILY FOR 21 DAYS THEN DONT TAKE FOR 7DAYS. 21 tablet 5  . triamcinolone ointment (KENALOG) 0.1 %   0  . VECTICAL 3 MCG/GM cream Apply 1 application topically daily.     . clobetasol cream (TEMOVATE) 5.36 % Apply 1 application topically 2 (two) times daily as needed (feet).     Marland Kitchen Umeclidinium Bromide (INCRUSE ELLIPTA) 62.5 MCG/INH AEPB Inhale 1 puff into the lungs daily. 30 each 11   No facility-administered medications prior to visit.    ROS Review of Systems  Constitutional: Negative.  Negative for fever, chills and fatigue.  HENT: Negative.  Negative for facial swelling, sinus pressure, sore throat and trouble swallowing.   Eyes: Negative.   Respiratory: Negative.  Negative for cough, choking, chest tightness, shortness of breath and stridor.   Cardiovascular: Negative.   Negative for chest pain, palpitations and leg swelling.  Gastrointestinal: Negative.  Negative for nausea, vomiting, abdominal pain, constipation and blood in stool.  Endocrine: Negative.   Genitourinary: Negative.   Musculoskeletal: Negative.   Skin: Negative.  Negative for color change, pallor and rash.  Allergic/Immunologic: Negative.   Neurological: Negative.  Negative for tremors, weakness, light-headedness, numbness and headaches.  Hematological: Negative.  Negative for adenopathy. Does not bruise/bleed easily.  Psychiatric/Behavioral: Negative for suicidal ideas, sleep disturbance, self-injury, dysphoric mood and decreased concentration. The patient is nervous/anxious.     Objective:  BP 130/78 mmHg  Pulse 84  Temp(Src) 97.9 F (36.6 C) (Oral)  Resp 16  Ht '5\' 8"'$  (1.727 m)  Wt 130 lb (58.968 kg)  BMI 19.77 kg/m2  SpO2 98%  BP Readings from Last 3 Encounters:  01/11/16 130/78  09/13/15 138/78  06/29/15 116/72    Wt Readings from Last 3 Encounters:  01/11/16 130 lb (58.968 kg)  09/13/15 129 lb (58.514 kg)  06/29/15 126 lb 12.8 oz (57.516 kg)    Physical Exam  Constitutional: She is oriented to person, place, and time. She appears well-developed and well-nourished. No distress.  HENT:  Head: Normocephalic and atraumatic.  Mouth/Throat: Oropharynx is clear and moist. No oropharyngeal exudate.  Eyes: Conjunctivae are normal. Right eye exhibits no discharge. Left eye exhibits no discharge. No scleral icterus.  Neck: Normal range of motion. Neck supple. No JVD present. No  tracheal deviation present. No thyromegaly present.  Cardiovascular: Normal rate, regular rhythm, normal heart sounds and intact distal pulses.  Exam reveals no gallop and no friction rub.   No murmur heard. Pulmonary/Chest: Effort normal and breath sounds normal. No stridor. No respiratory distress. She has no wheezes. She has no rales. She exhibits no tenderness.  Abdominal: Soft. Bowel sounds are  normal. She exhibits no distension and no mass. There is no tenderness. There is no rebound and no guarding.  Musculoskeletal: Normal range of motion. She exhibits no edema or tenderness.  Lymphadenopathy:    She has no cervical adenopathy.  Neurological: She is oriented to person, place, and time.  Skin: Skin is warm and dry. No rash noted. She is not diaphoretic. No erythema. No pallor.  Psychiatric: Her behavior is normal. Judgment and thought content normal. Her mood appears anxious. Her speech is not rapid and/or pressured, not delayed and not tangential. She is not agitated, not slowed and not withdrawn. Cognition and memory are normal. Cognition and memory are not impaired. She does not exhibit a depressed mood. She expresses no homicidal and no suicidal ideation. She expresses no suicidal plans and no homicidal plans. She exhibits normal remote memory. She is attentive.  Vitals reviewed.   Lab Results  Component Value Date   WBC 6.8 01/11/2016   HGB 14.9 01/11/2016   HCT 43.7 01/11/2016   PLT 403.0* 01/11/2016   GLUCOSE 100* 01/11/2016   CHOL 153 01/11/2016   TRIG 80.0 01/11/2016   HDL 49.80 01/11/2016   LDLCALC 87 01/11/2016   ALT 7 04/14/2015   AST 12 04/14/2015   NA 136 01/11/2016   K 4.3 01/11/2016   CL 99 01/11/2016   CREATININE 0.80 01/11/2016   BUN 8 01/11/2016   CO2 32 01/11/2016   TSH 0.97 04/14/2015   INR 1.0 04/22/2009   HGBA1C 5.8 01/11/2016    Dg Chest 2 View  04/25/2015  CLINICAL DATA:  One month history of dyspnea on exertion; history of COPD and smoking EXAM: CHEST  2 VIEW COMPARISON:  Chest x-ray dated December 08, 2013 FINDINGS: The lungs remain markedly hyperinflated with hemidiaphragm flattening and increased AP dimension of the thorax. There is no alveolar infiltrate. No pulmonary parenchymal nodules or masses are observed. There is no pleural effusion or pneumothorax. The heart and pulmonary vascularity are normal. 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 IMPRESSION: COPD. There is no pneumonia nor other acute cardiopulmonary disease. Electronically Signed   By: David  [place] M.D.   On: 04/25/2015 11:51    Assessment & Plan:   Lamiyah was seen today for copd.  Diagnoses and all orders for this visit:  Atherosclerosis of aorta (HCC)- In light of her atherosclerosis her LDL goal is 70 which she has not met. Unfortunately she is not willing to take a statin, I will continue to monitor this and advise her in the future. -     Lipid panel; Future -     aspirin EC 81 MG tablet; Take 1 tablet (81 mg total) by mouth daily.  Cigarette smoker- I've asked her to undergo a lung scan to screen for early signs of lung cancer -     Ambulatory Referral for Lung Cancer Scre  COPD GOLD II/ emphysematous features - symptomatically she is doing well, she has a history of pulmonary nodules of asked her to undergo a CT lung cancer screening, I encouraged her to continue use the inhaler as  recommended -     Ambulatory Referral for Lung Cancer Scre  Multiple lung nodules -     Ambulatory Referral for Lung Cancer Scre  Hyperglycemia- she is prediabetic, no medications are needed at this time. 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 -     CBC with Differential/Platelet; Future   I have discontinued Ms. Terrero's clobetasol cream and umeclidinium bromide. I am also having her start on aspirin EC. Additionally, I am having her maintain her Nutritional Supplements (BOOST PLUS PO), VECTICAL, Cetirizine HCl (ZYRTEC PO), multivitamin, clorazepate, triamcinolone ointment, PREMARIN, and OVER THE COUNTER MEDICATION.  Meds ordered this encounter  Medications  . OVER THE COUNTER MEDICATION    Sig: TAR COMPOUND APPLY TO FEET  .  aspirin EC 81 MG tablet    Sig: Take 1 tablet (81 mg total) by mouth daily.    Dispense:  90 tablet    Refill:  3     Follow-up: Return if symptoms worsen or fail to improve.  Scarlette Calico, MD

## 2016-01-19 ENCOUNTER — Other Ambulatory Visit: Payer: Self-pay | Admitting: Acute Care

## 2016-01-19 DIAGNOSIS — F17210 Nicotine dependence, cigarettes, uncomplicated: Principal | ICD-10-CM

## 2016-01-24 ENCOUNTER — Encounter: Payer: Self-pay | Admitting: Acute Care

## 2016-01-24 ENCOUNTER — Inpatient Hospital Stay: Admission: RE | Admit: 2016-01-24 | Payer: Medicare Other | Source: Ambulatory Visit

## 2016-01-24 ENCOUNTER — Ambulatory Visit (INDEPENDENT_AMBULATORY_CARE_PROVIDER_SITE_OTHER): Payer: Medicare Other | Admitting: Acute Care

## 2016-01-24 DIAGNOSIS — F17210 Nicotine dependence, cigarettes, uncomplicated: Secondary | ICD-10-CM

## 2016-01-24 NOTE — Progress Notes (Signed)
Shared Decision Making Visit Lung Cancer Screening Program [phone])   Eligibility:  Age 75 y.o.  Pack Years Smoking History Calculation 75 pack year smoking history (# packs/per year x # years smoked)  Recent History of coughing up blood  no  Unexplained weight loss? no ( >Than 15 pounds within the last 6 months )  Prior History Lung / other cancer no (Diagnosis within the last 5 years already requiring surveillance chest CT Scans).  Smoking Status Current Smoker  Former Smokers: Years since quit: Not applicable current smoker  Quit Date: Not applicable  Visit Components:  Discussion included one or more decision making aids. yes  Discussion included risk/benefits of screening. yes  Discussion included potential follow up diagnostic testing for abnormal scans. yes  Discussion included meaning and risk of over diagnosis. yes  Discussion included meaning and risk of False Positives. yes  Discussion included meaning of total radiation exposure. yes  Counseling Included:  Importance of adherence to annual lung cancer LDCT screening. yes  Impact of comorbidities on ability to participate in the program. yes  Ability and willingness to under diagnostic treatment. yes  Smoking Cessation Counseling:  Current Smokers:   Discussed importance of smoking cessation. yes  Information about tobacco cessation classes and interventions provided to patient. yes  Patient provided with "ticket" for LDCT Scan. yes  Symptomatic Patient. no  Counseling : not applicable  Diagnosis Code: Tobacco Use Z72.0  Asymptomatic Patient Yes  Counseling (Intermediate counseling: > three minutes counseling) [id number]  Former Smokers:   Discussed the importance of maintaining cigarette abstinence. yes  Diagnosis Code: Personal History of Nicotine Dependence. [id number]  Information about tobacco cessation classes and interventions provided to patient. Yes  Patient provided with "ticket" for  LDCT Scan. yes  Written Order for Lung Cancer Screening with LDCT placed in Epic. Yes (CT Chest Lung Cancer Screening Low Dose W/O CM) [id number] Z12.2-Screening of respiratory organs Z87.891-Personal history of nicotine dependence  I have spent 15 minutes of face to face time with [name] discussing the risks and benefits of lung cancer screening. We viewed a power point together that explained in detail the above noted topics. We paused at intervals to allow for questions to be asked and answered to ensure understanding.We discussed that the single most powerful action that she can take to decrease her risk of developing lung cancer is to quit smoking. We discussed whether or not she is ready to commit to setting a quit date. She is currently not ready to set a quit date. We discussed options for tools to aid in quitting smoking including nicotine replacement therapy, non-nicotine medications, support groups, Quit Smart classes, and behavior modification. We discussed that often times setting smaller, more achievable goals, such as eliminating 1 cigarette a day for a week and then 2 cigarettes a day for a week can be helpful in slowly decreasing the number of cigarettes smoked. This allows for a sense of accomplishment as well as providing a clinical benefit. I gave [name] the " Be Stronger Than Your Excuses" card with contact information for community resources, classes, free nicotine replacement therapy, and access to mobile apps, text messaging, and on-line smoking cessation help. I have also given her my card and contact information in the event she needs to contact me. We discussed the time and location of the scan, and that either June Leap, CMA, or I will call with the results within 24-48 hours of receiving them. I have provided her with a  copy of the power point we viewed  as a resource in the event they need reinforcement of the concepts we discussed today in the office. The patient  verbalized understanding of all of  the above and had no further questions upon leaving the office. They have my contact information in the event they have any further questions.   Magdalen Spatz, NP 01/24/2016

## 2016-01-25 ENCOUNTER — Ambulatory Visit (INDEPENDENT_AMBULATORY_CARE_PROVIDER_SITE_OTHER)
Admission: RE | Admit: 2016-01-25 | Discharge: 2016-01-25 | Disposition: A | Discharge status: Home / Self Care | Payer: Medicare Other | Source: Ambulatory Visit | Attending: Acute Care | Admitting: Acute Care

## 2016-01-25 DIAGNOSIS — F17210 Nicotine dependence, cigarettes, uncomplicated: Secondary | ICD-10-CM

## 2016-02-04 ENCOUNTER — Telehealth: Payer: Self-pay | Admitting: Acute Care

## 2016-02-04 NOTE — Telephone Encounter (Signed)
I have called [name] twice once on April 1 and again today on April 8 to give her the results of her low-dose CT screening scan. Both dates I have left a message with my contact information requesting that she return my call. We have not had a return call, we will attempt one more call and then we will send the patient a registered letter with results.

## 2016-02-04 NOTE — Telephone Encounter (Signed)
I attempted to call Ms. [name] to give her the results of her low dose screening CT. I had previously called on April 1 and was able to leave a message. Today the phone was picked up and hung up 2, resulting in my inability to leave a message. We will send the results of this screening scan in the mail to [name] through registered letter.

## 2016-02-14 ENCOUNTER — Telehealth: Payer: Self-pay | Admitting: Acute Care

## 2016-02-14 NOTE — Telephone Encounter (Signed)
I have called the results of the patient's low dose CT scan. I explained to her that her scan was read as a Lung RADS 1, which is a negative scan.I told her we would call and schedule her scan for next year. We also talked about 2 incidental findings on the scan that I have forwarded to her PCP for follow up as he feels is clinically indicated. One was some coronary artery calcification. She states that her cholesterol and triglycerides are normal, and that her PCP checks them yearly. 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

## 2016-03-05 ENCOUNTER — Other Ambulatory Visit: Payer: Self-pay | Admitting: Internal Medicine

## 2016-03-05 NOTE — Telephone Encounter (Signed)
pcp out of office please advise

## 2016-03-05 NOTE — Telephone Encounter (Signed)
faxed

## 2016-04-25 ENCOUNTER — Encounter: Payer: Self-pay | Admitting: Internal Medicine

## 2016-04-25 ENCOUNTER — Other Ambulatory Visit (INDEPENDENT_AMBULATORY_CARE_PROVIDER_SITE_OTHER): Payer: Medicare Other

## 2016-04-25 ENCOUNTER — Ambulatory Visit (INDEPENDENT_AMBULATORY_CARE_PROVIDER_SITE_OTHER): Payer: Medicare Other | Admitting: Internal Medicine

## 2016-04-25 VITALS — BP 124/80 | HR 87 | Temp 98.7°F | Resp 16 | Ht 68.0 in | Wt 123.0 lb

## 2016-04-25 DIAGNOSIS — D473 Essential (hemorrhagic) thrombocythemia: Secondary | ICD-10-CM | POA: Diagnosis not present

## 2016-04-25 DIAGNOSIS — Z0000 Encounter for general adult medical examination without abnormal findings: Secondary | ICD-10-CM | POA: Diagnosis not present

## 2016-04-25 DIAGNOSIS — J449 Chronic obstructive pulmonary disease, unspecified: Secondary | ICD-10-CM

## 2016-04-25 DIAGNOSIS — Z1231 Encounter for screening mammogram for malignant neoplasm of breast: Secondary | ICD-10-CM

## 2016-04-25 LAB — CBC WITH DIFFERENTIAL/PLATELET
BASOS ABS: 0.1 10*3/uL (ref 0.0–0.1)
Basophils Relative: 0.7 % (ref 0.0–3.0)
EOS ABS: 0 10*3/uL (ref 0.0–0.7)
Eosinophils Relative: 0.5 % (ref 0.0–5.0)
HEMATOCRIT: 42.5 % (ref 36.0–46.0)
HEMOGLOBIN: 14.2 g/dL (ref 12.0–15.0)
LYMPHS PCT: 23.9 % (ref 12.0–46.0)
Lymphs Abs: 1.9 10*3/uL (ref 0.7–4.0)
MCHC: 33.5 g/dL (ref 30.0–36.0)
MCV: 85.9 fl (ref 78.0–100.0)
MONOS PCT: 5.5 % (ref 3.0–12.0)
Monocytes Absolute: 0.4 10*3/uL (ref 0.1–1.0)
Neutro Abs: 5.5 10*3/uL (ref 1.4–7.7)
Neutrophils Relative %: 69.4 % (ref 43.0–77.0)
Platelets: 402 10*3/uL — ABNORMAL HIGH (ref 150.0–400.0)
RBC: 4.95 Mil/uL (ref 3.87–5.11)
RDW: 13.7 % (ref 11.5–15.5)
WBC: 8 10*3/uL (ref 4.0–10.5)

## 2016-04-25 NOTE — Progress Notes (Signed)
Subjective:  Patient ID: [name], female    DOB: 05-Jul-1941  Age: 75 y.o. MRN: [id number]  CC: Annual Exam   HPI [name] presents for a CPX.  She is concerned about her slightly elevated platelet count. A couple months ago she had easy bruising so she stopped taking aspirin and tells me the bruising has resolved. She since then has had no bruising, bleeding, blood in her nose, gums, stool, or urine. She otherwise offers no new complaints other today. She has ongoing issues with insomnia and anxiety. She has chronic pulmonary symptoms including intermittent shortness of breath but has decided not to use an inhaler.  Outpatient Prescriptions Prior to Visit  Medication Sig Dispense Refill  . Cetirizine HCl (ZYRTEC PO) Take 1 tablet by mouth daily.     . clorazepate (TRANXENE) 7.5 MG tablet TAKE 1/2 TABLET AT BEDTIME AND 1/2 TABLET AS NEEDED. 60 tablet 0  . Multiple Vitamin (MULTIVITAMIN) tablet Take 1 tablet by mouth daily.    . Nutritional Supplements (BOOST PLUS PO) Take 1 Can by mouth daily.     Marland Kitchen OVER THE COUNTER MEDICATION TAR COMPOUND APPLY TO FEET    . PREMARIN 0.3 MG tablet TAKE 1 TABLET ONCE DAILY. TAKE DAILY FOR 21 DAYS THEN DONT TAKE FOR 7DAYS. 21 tablet 5  . triamcinolone ointment (KENALOG) 0.1 %   0  . VECTICAL 3 MCG/GM cream Apply 1 application topically daily.     Marland Kitchen aspirin EC 81 MG tablet Take 1 tablet (81 mg total) by mouth daily. (Patient not taking: Reported on 04/25/2016) 90 tablet 3   No facility-administered medications prior to visit.    ROS Review of Systems  Constitutional: Negative for fever, chills, diaphoresis, appetite change and fatigue.  HENT: Negative.  Negative for nosebleeds, postnasal drip and trouble swallowing.   Eyes: Negative.   Respiratory: Negative.  Negative for cough, choking, chest tightness, shortness of breath and stridor.   Cardiovascular: Negative.  Negative for chest pain, palpitations and leg swelling.  Gastrointestinal:  Negative.  Negative for nausea, vomiting, abdominal pain, diarrhea, constipation and blood in stool.  Endocrine: Negative.   Genitourinary: Negative.  Negative for dysuria, urgency, frequency, hematuria, flank pain, decreased urine volume and difficulty urinating.  Musculoskeletal: Negative.   Skin: Negative.   Allergic/Immunologic: Negative.   Neurological: Negative.  Negative for dizziness, weakness, light-headedness and headaches.  Hematological: Negative.  Negative for adenopathy. Does not bruise/bleed easily.  Psychiatric/Behavioral: Positive for sleep disturbance. Negative for suicidal ideas, behavioral problems, self-injury, dysphoric mood, decreased concentration and agitation. The patient is nervous/anxious. The patient is not hyperactive.     Objective:  BP 124/80 mmHg  Pulse 87  Temp(Src) 98.7 F (37.1 C) (Oral)  Resp 16  Ht 5\' 8"  (1.727 m)  Wt 123 lb (55.792 kg)  BMI 18.71 kg/m2  SpO2 97%  BP Readings from Last 3 Encounters:  04/25/16 124/80  01/11/16 130/78  09/13/15 138/78    Wt Readings from Last 3 Encounters:  04/25/16 123 lb (55.792 kg)  01/11/16 130 lb (58.968 kg)  09/13/15 129 lb (58.514 kg)    Physical Exam  Constitutional: She is oriented to person, place, and time. No distress.  HENT:  Mouth/Throat: Oropharynx is clear and moist. No oropharyngeal exudate.  Eyes: Conjunctivae are normal. Right eye exhibits no discharge. Left eye exhibits no discharge. No scleral icterus.  Neck: Normal range of motion. Neck supple. No JVD present. No tracheal deviation present. No thyromegaly present.  Cardiovascular: Normal  rate, regular rhythm, normal heart sounds and intact distal pulses.  Exam reveals no gallop and no friction rub.   No murmur heard. Pulmonary/Chest: Effort normal and breath sounds normal. No stridor. No respiratory distress. She has no wheezes. She has no rales. She exhibits no tenderness.  Abdominal: Soft. Bowel sounds are normal. She exhibits no  distension and no mass. There is no tenderness. There is no rebound and no guarding.  Musculoskeletal: Normal range of motion. She exhibits no edema or tenderness.  Lymphadenopathy:    She has no cervical adenopathy.  Neurological: She is oriented to person, place, and time.  Skin: Skin is warm and dry. Rash noted. No bruising, no ecchymosis and no petechiae noted. Rash is not maculopapular and not pustular. She is not diaphoretic. No erythema. No pallor.  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 Her behavior is normal. Judgment and thought content normal.  Vitals reviewed.   Lab Results  Component Value Date   WBC 8.0 04/25/2016   HGB 14.2 04/25/2016   HCT 42.5 04/25/2016   PLT 402.0* 04/25/2016   GLUCOSE 100* 01/11/2016   CHOL 153 01/11/2016   TRIG 80.0 01/11/2016   HDL 49.80 01/11/2016   LDLCALC 87 01/11/2016   ALT 7 04/14/2015   AST 12 04/14/2015   NA 136 01/11/2016   K 4.3 01/11/2016   CL 99 01/11/2016   CREATININE 0.80 01/11/2016   BUN 8 01/11/2016   CO2 32 01/11/2016   TSH 0.97 04/14/2015   INR 1.0 04/22/2009   HGBA1C 5.8 01/11/2016    Ct Chest Lung Ca Screen Low Dose W/o Cm  01/25/2016  CLINICAL DATA:  Current smoker, 55 pack-year history, lung cancer screening. EXAM: CT CHEST WITHOUT CONTRAST LOW-DOSE FOR LUNG CANCER SCREENING TECHNIQUE: Multidetector CT imaging of the chest was performed following the standard protocol without IV contrast. COMPARISON:  10/06/2012. 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 No pathologically enlarged mediastinal or axillary lymph nodes. Hilar regions are difficult to definitively evaluate without IV contrast. Coronary artery calcification. Heart size normal. No pericardial effusion. Lungs/Pleura: Moderate centrilobular emphysema. Biapical pleural parenchymal scarring. No worrisome pulmonary nodules. No pleural fluid. Airway is  unremarkable. Upper abdomen: Visualized portion of the liver is unremarkable. 12 mm fluid density nodule in the right adrenal gland. Visualized portions of the left adrenal gland, left kidney, spleen, pancreas and stomach are grossly unremarkable. Musculoskeletal: Flowing anterior osteophytosis in the thoracic spine with multilevel degenerative disc disease. IMPRESSION: 1. Lung-RADS Category 1, negative. Continue annual screening with low-dose chest CT without contrast in 12 months. 2. Coronary artery calcification. 3. Right adrenal adenoma. Electronically Signed   By: Lorin Picket M.D.   On: 01/25/2016 13:04    Assessment & Plan:   Vernel was seen today for annual exam.  Diagnoses and all orders for this visit:  Essential thrombocytosis ([place])- her platelet count remains very slightly elevated but all of her other cell lines are normal and she has no history of coagulopathy other than the aspirin related bleeding which has resolved since she stopped taking aspirin. This appears to be a benign, reactive elevated platelet count associated with inflammatory condition such as COPD and psoriasis. -     CBC with Differential/Platelet; Future  Visit for screening mammogram -     MM DIGITAL SCREENING BILATERAL; Future  COPD GOLD II/ emphysematous features - her symptoms are adequately well controlled and at this time she does  not feel the need to use an inhaler.  Routine general medical examination at a health care facility   I have discontinued Ms. Varelas's VECTICAL and aspirin EC. I am also having her maintain her Nutritional Supplements (BOOST PLUS PO), Cetirizine HCl (ZYRTEC PO), multivitamin, triamcinolone ointment, PREMARIN, OVER THE COUNTER MEDICATION, and clorazepate.  No orders of the defined types were placed in this encounter.   See AVS for instructions about healthy living and anticipatory guidance.  Follow-up: Return in about 6 months (around 10/25/2016).  Scarlette Calico, MD

## 2016-04-25 NOTE — Progress Notes (Signed)
Pre visit review using our clinic review tool, if applicable. No additional management support is needed unless otherwise documented below in the visit note. 

## 2016-04-25 NOTE — Patient Instructions (Signed)
Preventive Care for Adults, Female A healthy lifestyle and preventive care can promote health and wellness. Preventive health guidelines for women include the following key practices.  A routine yearly physical is a good way to check with your health care provider about your health and preventive screening. It is a chance to share any concerns and updates on your health and to receive a thorough exam.  Visit your dentist for a routine exam and preventive care every 6 months. Brush your teeth twice a day and floss once a day. Good oral hygiene prevents tooth decay and gum disease.  The frequency of eye exams is based on your age, health, family medical history, use of contact lenses, and other factors. Follow your health care provider's recommendations for frequency of eye exams.  Eat a healthy diet. Foods like vegetables, fruits, whole grains, low-fat dairy products, and lean protein foods contain the nutrients you need without too many calories. Decrease your intake of foods high in solid fats, added sugars, and salt. Eat the right amount of calories for you.Get information about a proper diet from your health care provider, if necessary.  Regular physical exercise is one of the most important things you can do for your health. Most adults should get at least 150 minutes of moderate-intensity exercise (any activity that increases your heart rate and causes you to sweat) each week. In addition, most adults need muscle-strengthening exercises on 2 or more days a week.  Maintain a healthy weight. The body mass index (BMI) is a screening tool to identify possible weight problems. It provides an estimate of body fat based on height and weight. Your health care provider can find your BMI and can help you achieve or maintain a healthy weight.For adults 20 years and older:  A BMI below 18.5 is considered underweight.  A BMI of 18.5 to 24.9 is normal.  A BMI of 25 to 29.9 is considered overweight.  A  BMI of 30 and above is considered obese.  Maintain normal blood lipids and cholesterol levels by exercising and minimizing your intake of saturated fat. Eat a balanced diet with plenty of fruit and vegetables. Blood tests for lipids and cholesterol should begin at age 45 and be repeated every 5 years. If your lipid or cholesterol levels are high, you are over 50, or you are at high risk for heart disease, you may need your cholesterol levels checked more frequently.Ongoing high lipid and cholesterol levels should be treated with medicines if diet and exercise are not working.  If you smoke, find out from your health care provider how to quit. If you do not use tobacco, do not start.  Lung cancer screening is recommended for adults aged 45-80 years who are at high risk for developing lung cancer because of a history of smoking. A yearly low-dose CT scan of the lungs is recommended for people who have at least a 30-pack-year history of smoking and are a current smoker or have quit within the past 15 years. A pack year of smoking is smoking an average of 1 pack of cigarettes a day for 1 year (for example: 1 pack a day for 30 years or 2 packs a day for 15 years). Yearly screening should continue until the smoker has stopped smoking for at least 15 years. Yearly screening should be stopped for people who develop a health problem that would prevent them from having lung cancer treatment.  If you are pregnant, do not drink alcohol. If you are  breastfeeding, be very cautious about drinking alcohol. If you are not pregnant and choose to drink alcohol, do not have more than 1 drink per day. One drink is considered to be 12 ounces (355 mL) of beer, 5 ounces (148 mL) of wine, or 1.5 ounces (44 mL) of liquor.  Avoid use of street drugs. Do not share needles with anyone. Ask for help if you need support or instructions about stopping the use of drugs.  High blood pressure causes heart disease and increases the risk  of stroke. Your blood pressure should be checked at least every 1 to 2 years. Ongoing high blood pressure should be treated with medicines if weight loss and exercise do not work.  If you are 55-79 years old, ask your health care provider if you should take aspirin to prevent strokes.  Diabetes screening is done by taking a blood sample to check your blood glucose level after you have not eaten for a certain period of time (fasting). If you are not overweight and you do not have risk factors for diabetes, you should be screened once every 3 years starting at age 45. If you are overweight or obese and you are 40-70 years of age, you should be screened for diabetes every year as part of your cardiovascular risk assessment.  Breast cancer screening is essential preventive care for women. You should practice "breast self-awareness." This means understanding the normal appearance and feel of your breasts and may include breast self-examination. Any changes detected, no matter how small, should be reported to a health care provider. Women in their 20s and 30s should have a clinical breast exam (CBE) by a health care provider as part of a regular health exam every 1 to 3 years. After age 40, women should have a CBE every year. Starting at age 40, women should consider having a mammogram (breast X-ray test) every year. Women who have a family history of breast cancer should talk to their health care provider about genetic screening. Women at a high risk of breast cancer should talk to their health care providers about having an MRI and a mammogram every year.  Breast cancer gene (BRCA)-related cancer risk assessment is recommended for women who have family members with BRCA-related cancers. BRCA-related cancers include breast, ovarian, tubal, and peritoneal cancers. Having family members with these cancers may be associated with an increased risk for harmful changes (mutations) in the breast cancer genes BRCA1 and  BRCA2. Results of the assessment will determine the need for genetic counseling and BRCA1 and BRCA2 testing.  Your health care provider may recommend that you be screened regularly for cancer of the pelvic organs (ovaries, uterus, and vagina). This screening involves a pelvic examination, including checking for microscopic changes to the surface of your cervix (Pap test). You may be encouraged to have this screening done every 3 years, beginning at age 21.  For women ages 30-65, health care providers may recommend pelvic exams and Pap testing every 3 years, or they may recommend the Pap and pelvic exam, combined with testing for human papilloma virus (HPV), every 5 years. Some types of HPV increase your risk of cervical cancer. Testing for HPV may also be done on women of any age with unclear Pap test results.  Other health care providers may not recommend any screening for nonpregnant women who are considered low risk for pelvic cancer and who do not have symptoms. Ask your health care provider if a screening pelvic exam is right for   you.  If you have had past treatment for cervical cancer or a condition that could lead to cancer, you need Pap tests and screening for cancer for at least 20 years after your treatment. If Pap tests have been discontinued, your risk factors (such as having a new sexual partner) need to be reassessed to determine if screening should resume. Some women have medical problems that increase the chance of getting cervical cancer. In these cases, your health care provider may recommend more frequent screening and Pap tests.  Colorectal cancer can be detected and often prevented. Most routine colorectal cancer screening begins at the age of 50 years and continues through age 75 years. However, your health care provider may recommend screening at an earlier age if you have risk factors for colon cancer. On a yearly basis, your health care provider may provide home test kits to check  for hidden blood in the stool. Use of a small camera at the end of a tube, to directly examine the colon (sigmoidoscopy or colonoscopy), can detect the earliest forms of colorectal cancer. Talk to your health care provider about this at age 50, when routine screening begins. Direct exam of the colon should be repeated every 5-10 years through age 75 years, unless early forms of precancerous polyps or small growths are found.  People who are at an increased risk for hepatitis B should be screened for this virus. You are considered at high risk for hepatitis B if:  You were born in a country where hepatitis B occurs often. Talk with your health care provider about which countries are considered high risk.  Your parents were born in a high-risk country and you have not received a shot to protect against hepatitis B (hepatitis B vaccine).  You have HIV or AIDS.  You use needles to inject street drugs.  You live with, or have sex with, someone who has hepatitis B.  You get hemodialysis treatment.  You take certain medicines for conditions like cancer, organ transplantation, and autoimmune conditions.  Hepatitis C blood testing is recommended for all people born from 1945 through 1965 and any individual with known risks for hepatitis C.  Practice safe sex. Use condoms and avoid high-risk sexual practices to reduce the spread of sexually transmitted infections (STIs). STIs include gonorrhea, chlamydia, syphilis, trichomonas, herpes, HPV, and human immunodeficiency virus (HIV). Herpes, HIV, and HPV are viral illnesses that have no cure. They can result in disability, cancer, and death.  You should be screened for sexually transmitted illnesses (STIs) including gonorrhea and chlamydia if:  You are sexually active and are younger than 24 years.  You are older than 24 years and your health care provider tells you that you are at risk for this type of infection.  Your sexual activity has changed  since you were last screened and you are at an increased risk for chlamydia or gonorrhea. Ask your health care provider if you are at risk.  If you are at risk of being infected with HIV, it is recommended that you take a prescription medicine daily to prevent HIV infection. This is called preexposure prophylaxis (PrEP). You are considered at risk if:  You are sexually active and do not regularly use condoms or know the HIV status of your partner(s).  You take drugs by injection.  You are sexually active with a partner who has HIV.  Talk with your health care provider about whether you are at high risk of being infected with HIV. If   you choose to begin PrEP, you should first be tested for HIV. You should then be tested every 3 months for as long as you are taking PrEP.  Osteoporosis is a disease in which the bones lose minerals and strength with aging. This can result in serious bone fractures or breaks. The risk of osteoporosis can be identified using a bone density scan. Women ages 67 years and over and women at risk for fractures or osteoporosis should discuss screening with their health care providers. Ask your health care provider whether you should take a calcium supplement or vitamin D to reduce the rate of osteoporosis.  Menopause can be associated with physical symptoms and risks. Hormone replacement therapy is available to decrease symptoms and risks. You should talk to your health care provider about whether hormone replacement therapy is right for you.  Use sunscreen. Apply sunscreen liberally and repeatedly throughout the day. You should seek shade when your shadow is shorter than you. Protect yourself by wearing long sleeves, pants, a wide-brimmed hat, and sunglasses year round, whenever you are outdoors.  Once a month, do a whole body skin exam, using a mirror to look at the skin on your back. Tell your health care provider of new moles, moles that have irregular borders, moles that  are larger than a pencil eraser, or moles that have changed in shape or color.  Stay current with required vaccines (immunizations).  Influenza vaccine. All adults should be immunized every year.  Tetanus, diphtheria, and acellular pertussis (Td, Tdap) vaccine. Pregnant women should receive 1 dose of Tdap vaccine during each pregnancy. The dose should be obtained regardless of the length of time since the last dose. Immunization is preferred during the 27th-36th week of gestation. An adult who has not previously received Tdap or who does not know her vaccine status should receive 1 dose of Tdap. This initial dose should be followed by tetanus and diphtheria toxoids (Td) booster doses every 10 years. Adults with an unknown or incomplete history of completing a 3-dose immunization series with Td-containing vaccines should begin or complete a primary immunization series including a Tdap dose. Adults should receive a Td booster every 10 years.  Varicella vaccine. An adult without evidence of immunity to varicella should receive 2 doses or a second dose if she has previously received 1 dose. Pregnant females who do not have evidence of immunity should receive the first dose after pregnancy. This first dose should be obtained before leaving the health care facility. The second dose should be obtained 4-8 weeks after the first dose.  Human papillomavirus (HPV) vaccine. Females aged 13-26 years who have not received the vaccine previously should obtain the 3-dose series. The vaccine is not recommended for use in pregnant females. However, pregnancy testing is not needed before receiving a dose. If a female is found to be pregnant after receiving a dose, no treatment is needed. In that case, the remaining doses should be delayed until after the pregnancy. Immunization is recommended for any person with an immunocompromised condition through the age of 61 years if she did not get any or all doses earlier. During the  3-dose series, the second dose should be obtained 4-8 weeks after the first dose. The third dose should be obtained 24 weeks after the first dose and 16 weeks after the second dose.  Zoster vaccine. One dose is recommended for adults aged 30 years or older unless certain conditions are present.  Measles, mumps, and rubella (MMR) vaccine. Adults born  before 1957 generally are considered immune to measles and mumps. Adults born in 1957 or later should have 1 or more doses of MMR vaccine unless there is a contraindication to the vaccine or there is laboratory evidence of immunity to each of the three diseases. A routine second dose of MMR vaccine should be obtained at least 28 days after the first dose for students attending postsecondary schools, health care workers, or international travelers. People who received inactivated measles vaccine or an unknown type of measles vaccine during 1963-1967 should receive 2 doses of MMR vaccine. People who received inactivated mumps vaccine or an unknown type of mumps vaccine before 1979 and are at high risk for mumps infection should consider immunization with 2 doses of MMR vaccine. For females of childbearing age, rubella immunity should be determined. If there is no evidence of immunity, females who are not pregnant should be vaccinated. If there is no evidence of immunity, females who are pregnant should delay immunization until after pregnancy. Unvaccinated health care workers born before 1957 who lack laboratory evidence of measles, mumps, or rubella immunity or laboratory confirmation of disease should consider measles and mumps immunization with 2 doses of MMR vaccine or rubella immunization with 1 dose of MMR vaccine.  Pneumococcal 13-valent conjugate (PCV13) vaccine. When indicated, a person who is uncertain of his immunization history and has no record of immunization should receive the PCV13 vaccine. All adults 65 years of age and older should receive this  vaccine. An adult aged 19 years or older who has certain medical conditions and has not been previously immunized should receive 1 dose of PCV13 vaccine. This PCV13 should be followed with a dose of pneumococcal polysaccharide (PPSV23) vaccine. Adults who are at high risk for pneumococcal disease should obtain the PPSV23 vaccine at least 8 weeks after the dose of PCV13 vaccine. Adults older than 74 years of age who have normal immune system function should obtain the PPSV23 vaccine dose at least 1 year after the dose of PCV13 vaccine.  Pneumococcal polysaccharide (PPSV23) vaccine. When PCV13 is also indicated, PCV13 should be obtained first. All adults aged 65 years and older should be immunized. An adult younger than age 65 years who has certain medical conditions should be immunized. Any person who resides in a nursing home or long-term care facility should be immunized. An adult smoker should be immunized. People with an immunocompromised condition and certain other conditions should receive both PCV13 and PPSV23 vaccines. People with human immunodeficiency virus (HIV) infection should be immunized as soon as possible after diagnosis. Immunization during chemotherapy or radiation therapy should be avoided. Routine use of PPSV23 vaccine is not recommended for American Indians, [place] Natives, or people younger than 65 years unless there are medical conditions that require PPSV23 vaccine. When indicated, people who have unknown immunization and have no record of immunization should receive PPSV23 vaccine. One-time revaccination 5 years after the first dose of PPSV23 is recommended for people aged 19-64 years who have chronic kidney failure, nephrotic syndrome, asplenia, or immunocompromised conditions. People who received 1-2 doses of PPSV23 before age 65 years should receive another dose of PPSV23 vaccine at age 65 years or later if at least 5 years have passed since the previous dose. Doses of PPSV23 are not  needed for people immunized with PPSV23 at or after age 65 years.  Meningococcal vaccine. Adults with asplenia or persistent complement component deficiencies should receive 2 doses of quadrivalent meningococcal conjugate (MenACWY-D) vaccine. The doses should be obtained   at least 2 months apart. Microbiologists working with certain meningococcal bacteria, [place] recruits, people at risk during an outbreak, and people who travel to or live in countries with a high rate of meningitis should be immunized. A first-year college student up through age 34 years who is living in a residence hall should receive a dose if she did not receive a dose on or after her 16th birthday. Adults who have certain high-risk conditions should receive one or more doses of vaccine.  Hepatitis A vaccine. Adults who wish to be protected from this disease, have certain high-risk conditions, work with hepatitis A-infected animals, work in hepatitis A research labs, or travel to or work in countries with a high rate of hepatitis A should be immunized. Adults who were previously unvaccinated and who anticipate close contact with an international adoptee during the first 60 days after arrival in the [place] from a country with a high rate of hepatitis A should be immunized.  Hepatitis B vaccine. Adults who wish to be protected from this disease, have certain high-risk conditions, may be exposed to blood or other infectious body fluids, are household contacts or sex partners of hepatitis B positive people, are clients or workers in certain care facilities, or travel to or work in countries with a high rate of hepatitis B should be immunized.  Haemophilus influenzae type b (Hib) vaccine. A previously unvaccinated person with asplenia or sickle cell disease or having a scheduled splenectomy should receive 1 dose of Hib vaccine. Regardless of previous immunization, a recipient of a hematopoietic stem cell transplant should receive a  3-dose series 6-12 months after her successful transplant. Hib vaccine is not recommended for adults with HIV infection. Preventive Services / Frequency Ages 35 to 4 years  Blood pressure check.** / Every 3-5 years.  Lipid and cholesterol check.** / Every 5 years beginning at age 60.  Clinical breast exam.** / Every 3 years for women in their 71s and 10s.  BRCA-related cancer risk assessment.** / For women who have family members with a BRCA-related cancer (breast, ovarian, tubal, or peritoneal cancers).  Pap test.** / Every 2 years from ages 76 through 26. Every 3 years starting at age 61 through age 76 or 93 with a history of 3 consecutive normal Pap tests.  HPV screening.** / Every 3 years from ages 37 through ages 60 to 51 with a history of 3 consecutive normal Pap tests.  Hepatitis C blood test.** / For any individual with known risks for hepatitis C.  Skin self-exam. / Monthly.  Influenza vaccine. / Every year.  Tetanus, diphtheria, and acellular pertussis (Tdap, Td) vaccine.** / Consult your health care provider. Pregnant women should receive 1 dose of Tdap vaccine during each pregnancy. 1 dose of Td every 10 years.  Varicella vaccine.** / Consult your health care provider. Pregnant females who do not have evidence of immunity should receive the first dose after pregnancy.  HPV vaccine. / 3 doses over 6 months, if 93 and younger. The vaccine is not recommended for use in pregnant females. However, pregnancy testing is not needed before receiving a dose.  Measles, mumps, rubella (MMR) vaccine.** / You need at least 1 dose of MMR if you were born in 1957 or later. You may also need a 2nd dose. For females of childbearing age, rubella immunity should be determined. If there is no evidence of immunity, females who are not pregnant should be vaccinated. If there is no evidence of immunity, females who are  pregnant should delay immunization until after pregnancy.  Pneumococcal  13-valent conjugate (PCV13) vaccine.** / Consult your health care provider.  Pneumococcal polysaccharide (PPSV23) vaccine.** / 1 to 2 doses if you smoke cigarettes or if you have certain conditions.  Meningococcal vaccine.** / 1 dose if you are age 68 to 8 years and a Market researcher living in a residence hall, or have one of several medical conditions, you need to get vaccinated against meningococcal disease. You may also need additional booster doses.  Hepatitis A vaccine.** / Consult your health care provider.  Hepatitis B vaccine.** / Consult your health care provider.  Haemophilus influenzae type b (Hib) vaccine.** / Consult your health care provider. Ages 7 to 53 years  Blood pressure check.** / Every year.  Lipid and cholesterol check.** / Every 5 years beginning at age 25 years.  Lung cancer screening. / Every year if you are aged 11-80 years and have a 30-pack-year history of smoking and currently smoke or have quit within the past 15 years. Yearly screening is stopped once you have quit smoking for at least 15 years or develop a health problem that would prevent you from having lung cancer treatment.  Clinical breast exam.** / Every year after age 48 years.  BRCA-related cancer risk assessment.** / For women who have family members with a BRCA-related cancer (breast, ovarian, tubal, or peritoneal cancers).  Mammogram.** / Every year beginning at age 41 years and continuing for as long as you are in good health. Consult with your health care provider.  Pap test.** / Every 3 years starting at age 65 years through age 37 or 70 years with a history of 3 consecutive normal Pap tests.  HPV screening.** / Every 3 years from ages 72 years through ages 60 to 40 years with a history of 3 consecutive normal Pap tests.  Fecal occult blood test (FOBT) of stool. / Every year beginning at age 21 years and continuing until age 5 years. You may not need to do this test if you get  a colonoscopy every 10 years.  Flexible sigmoidoscopy or colonoscopy.** / Every 5 years for a flexible sigmoidoscopy or every 10 years for a colonoscopy beginning at age 35 years and continuing until age 48 years.  Hepatitis C blood test.** / For all people born from [birth year] through 1965 and any individual with known risks for hepatitis C.  Skin self-exam. / Monthly.  Influenza vaccine. / Every year.  Tetanus, diphtheria, and acellular pertussis (Tdap/Td) vaccine.** / Consult your health care provider. Pregnant women should receive 1 dose of Tdap vaccine during each pregnancy. 1 dose of Td every 10 years.  Varicella vaccine.** / Consult your health care provider. Pregnant females who do not have evidence of immunity should receive the first dose after pregnancy.  Zoster vaccine.** / 1 dose for adults aged 30 years or older.  Measles, mumps, rubella (MMR) vaccine.** / You need at least 1 dose of MMR if you were born in 1957 or later. You may also need a second dose. For females of childbearing age, rubella immunity should be determined. If there is no evidence of immunity, females who are not pregnant should be vaccinated. If there is no evidence of immunity, females who are pregnant should delay immunization until after pregnancy.  Pneumococcal 13-valent conjugate (PCV13) vaccine.** / Consult your health care provider.  Pneumococcal polysaccharide (PPSV23) vaccine.** / 1 to 2 doses if you smoke cigarettes or if you have certain conditions.  Meningococcal vaccine.** /  Consult your health care provider.  Hepatitis A vaccine.** / Consult your health care provider.  Hepatitis B vaccine.** / Consult your health care provider.  Haemophilus influenzae type b (Hib) vaccine.** / Consult your health care provider. Ages 64 years and over  Blood pressure check.** / Every year.  Lipid and cholesterol check.** / Every 5 years beginning at age 23 years.  Lung cancer screening. / Every year if you  are aged 16-80 years and have a 30-pack-year history of smoking and currently smoke or have quit within the past 15 years. Yearly screening is stopped once you have quit smoking for at least 15 years or develop a health problem that would prevent you from having lung cancer treatment.  Clinical breast exam.** / Every year after age 74 years.  BRCA-related cancer risk assessment.** / For women who have family members with a BRCA-related cancer (breast, ovarian, tubal, or peritoneal cancers).  Mammogram.** / Every year beginning at age 44 years and continuing for as long as you are in good health. Consult with your health care provider.  Pap test.** / Every 3 years starting at age 58 years through age 22 or 39 years with 3 consecutive normal Pap tests. Testing can be stopped between 65 and 70 years with 3 consecutive normal Pap tests and no abnormal Pap or HPV tests in the past 10 years.  HPV screening.** / Every 3 years from ages 64 years through ages 70 or 61 years with a history of 3 consecutive normal Pap tests. Testing can be stopped between 65 and 70 years with 3 consecutive normal Pap tests and no abnormal Pap or HPV tests in the past 10 years.  Fecal occult blood test (FOBT) of stool. / Every year beginning at age 40 years and continuing until age 27 years. You may not need to do this test if you get a colonoscopy every 10 years.  Flexible sigmoidoscopy or colonoscopy.** / Every 5 years for a flexible sigmoidoscopy or every 10 years for a colonoscopy beginning at age 7 years and continuing until age 32 years.  Hepatitis C blood test.** / For all people born from [birth year] through 1964 and any individual with known risks for hepatitis C.  Osteoporosis screening.** / A one-time screening for women ages 30 years and over and women at risk for fractures or osteoporosis.  Skin self-exam. / Monthly.  Influenza vaccine. / Every year.  Tetanus, diphtheria, and acellular pertussis (Tdap/Td)  vaccine.** / 1 dose of Td every 10 years.  Varicella vaccine.** / Consult your health care provider.  Zoster vaccine.** / 1 dose for adults aged 35 years or older.  Pneumococcal 13-valent conjugate (PCV13) vaccine.** / Consult your health care provider.  Pneumococcal polysaccharide (PPSV23) vaccine.** / 1 dose for all adults aged 46 years and older.  Meningococcal vaccine.** / Consult your health care provider.  Hepatitis A vaccine.** / Consult your health care provider.  Hepatitis B vaccine.** / Consult your health care provider.  Haemophilus influenzae type b (Hib) vaccine.** / Consult your health care provider. ** Family history and personal history of risk and conditions may change your health care provider's recommendations.   This information is not intended to replace advice given to you by your health care provider. Make sure you discuss any questions you have with your health care provider.   Document Released: 12/11/2001 Document Revised: 11/05/2014 Document Reviewed: 03/12/2011 Elsevier Interactive Patient Education [employer].

## 2016-05-01 NOTE — Assessment & Plan Note (Signed)

## 2016-05-07 ENCOUNTER — Other Ambulatory Visit: Payer: Self-pay | Admitting: Internal Medicine

## 2016-05-09 ENCOUNTER — Ambulatory Visit
Admission: RE | Admit: 2016-05-09 | Discharge: 2016-05-09 | Disposition: A | Discharge status: Home / Self Care | Payer: Medicare Other | Source: Ambulatory Visit | Attending: Internal Medicine | Admitting: Internal Medicine

## 2016-05-09 DIAGNOSIS — Z1231 Encounter for screening mammogram for malignant neoplasm of breast: Secondary | ICD-10-CM

## 2016-05-09 LAB — HM MAMMOGRAPHY

## 2016-05-09 NOTE — Addendum Note (Signed)
Addended by: Janith Lima on: 05/09/2016 09:00 PM   Modules accepted: Miquel Dunn

## 2016-06-03 ENCOUNTER — Other Ambulatory Visit: Payer: Self-pay | Admitting: Family

## 2016-06-06 NOTE — Telephone Encounter (Signed)
Faxed script back to [facility].../lmb 

## 2016-06-06 NOTE — Telephone Encounter (Signed)
Pt left msg on triage stating pharmacy has been trying to get a refill on her Tranxene since Monday checking status on refill...Johny Chess

## 2016-08-23 ENCOUNTER — Encounter: Payer: Self-pay | Admitting: Nurse Practitioner

## 2016-08-23 ENCOUNTER — Ambulatory Visit (INDEPENDENT_AMBULATORY_CARE_PROVIDER_SITE_OTHER): Payer: Medicare Other | Admitting: Nurse Practitioner

## 2016-08-23 VITALS — BP 118/76 | HR 91 | Temp 97.6°F | Ht 68.0 in | Wt 123.0 lb

## 2016-08-23 DIAGNOSIS — H8110 Benign paroxysmal vertigo, unspecified ear: Secondary | ICD-10-CM | POA: Diagnosis not present

## 2016-08-23 DIAGNOSIS — H6982 Other specified disorders of Eustachian tube, left ear: Secondary | ICD-10-CM | POA: Diagnosis not present

## 2016-08-23 DIAGNOSIS — Z23 Encounter for immunization: Secondary | ICD-10-CM

## 2016-08-23 MED ORDER — ONDANSETRON HCL 4 MG PO TABS: 4.0000 mg | ORAL_TABLET | Freq: Three times a day (TID) | ORAL | 0 refills | Status: DC | PRN

## 2016-08-23 MED ORDER — MECLIZINE HCL 12.5 MG PO TABS: 12.5000 mg | ORAL_TABLET | Freq: Two times a day (BID) | ORAL | 0 refills | Status: DC | PRN

## 2016-08-23 NOTE — Progress Notes (Signed)
Subjective:  Patient ID: [name], female    DOB: 22-Mar-1941  Age: 75 y.o. MRN: [id number]  CC: Dizziness (Pt stated when get up in the morning get dizzy and went to urgent care in Sept 30)   Dizziness  This is a new problem. The current episode started more than 1 month ago. The problem occurs intermittently. The problem has been waxing and waning. Associated symptoms include anorexia, nausea and vertigo. Pertinent negatives include no abdominal pain, arthralgias, change in bowel habit, chest pain, chills, congestion, coughing, diaphoresis, fatigue, fever, headaches, neck pain, numbness, sore throat, swollen glands, urinary symptoms, visual change, vomiting or weakness. The symptoms are aggravated by standing (laying to sitting position). She has tried rest for the symptoms.  Ms. Maragos was evaluated by another provider with [facility] physicians 07/28/16. She was diagnosed with vertigo and advised to rest. Symptoms have resolved then. Symptoms returned yesterday morning. She denies any head injury. No focal neurologic deficit.  Outpatient Medications Prior to Visit  Medication Sig Dispense Refill  . Cetirizine HCl (ZYRTEC PO) Take 1 tablet by mouth daily.     . clorazepate (TRANXENE) 7.5 MG tablet TAKE 1/2 TABLET AT BEDTIME AND 1/2 TABLET AS NEEDED. 60 tablet 5  . Multiple Vitamin (MULTIVITAMIN) tablet Take 1 tablet by mouth daily.    . Nutritional Supplements (BOOST PLUS PO) Take 1 Can by mouth daily.     Marland Kitchen OVER THE COUNTER MEDICATION TAR COMPOUND APPLY TO FEET    . PREMARIN 0.3 MG tablet TAKE 1 TABLET ONCE DAILY. TAKE DAILY FOR 21 DAYS THEN DONT TAKE FOR 7DAYS. 21 tablet 5  . triamcinolone ointment (KENALOG) 0.1 %   0   No facility-administered medications prior to visit.     ROS See HPI  Objective:  BP 118/76 (BP Location: Left Arm, Patient Position: Sitting, Cuff Size: Normal)   Pulse 91   Temp 97.6 F (36.4 C)   Ht 5\' 8"  (1.727 m)   Wt 123 lb (55.8 kg)   SpO2 97%   BMI  18.70 kg/m   BP Readings from Last 3 Encounters:  08/23/16 118/76  04/25/16 124/80  01/11/16 130/78    Wt Readings from Last 3 Encounters:  08/23/16 123 lb (55.8 kg)  04/25/16 123 lb (55.8 kg)  01/11/16 130 lb (59 kg)    Physical Exam  Constitutional: She is oriented to person, place, and time. No distress.  HENT:  Right Ear: External ear normal.  Left Ear: External ear normal.  Nose: Nose normal.  Mouth/Throat: Oropharynx is clear and moist. No oropharyngeal exudate.  Eyes: Conjunctivae and EOM are normal. Pupils are equal, round, and reactive to light. No scleral icterus.  Neck: Normal range of motion. Neck supple. No thyromegaly present.  Cardiovascular: Normal rate, regular rhythm and normal heart sounds.   Pulmonary/Chest: Effort normal and breath sounds normal.  Musculoskeletal: Normal range of motion. She exhibits no edema.  Lymphadenopathy:    She has no cervical adenopathy.  Neurological: She is alert and oriented to person, place, and time. No cranial nerve deficit. Coordination normal.  Normal gait. Symptoms triggered without nystagmus, when asked to change position from supine to sitting up.  Skin: Skin is warm and dry. No rash noted. No erythema.  Psychiatric: She has a normal mood and affect. Her behavior is normal.  Vitals reviewed.   Lab Results  Component Value Date   WBC 8.0 04/25/2016   HGB 14.2 04/25/2016   HCT 42.5 04/25/2016   PLT  402.0 (H) 04/25/2016   GLUCOSE 100 (H) 01/11/2016   CHOL 153 01/11/2016   TRIG 80.0 01/11/2016   HDL 49.80 01/11/2016   LDLCALC 87 01/11/2016   ALT 7 04/14/2015   AST 12 04/14/2015   NA 136 01/11/2016   K 4.3 01/11/2016   CL 99 01/11/2016   CREATININE 0.80 01/11/2016   BUN 8 01/11/2016   CO2 32 01/11/2016   TSH 0.97 04/14/2015   INR 1.0 04/22/2009   HGBA1C 5.8 01/11/2016    Mm Digital Screening Bilateral  Result Date: 05/09/2016 CLINICAL DATA:  Screening. EXAM: DIGITAL SCREENING BILATERAL MAMMOGRAM WITH  CAD COMPARISON:  Previous exam(s). ACR Breast Density Category c: The breast tissue is heterogeneously dense, which may obscure small masses. FINDINGS: There are no findings suspicious for malignancy. Images were processed with CAD. IMPRESSION: No mammographic evidence of malignancy. A result letter of this screening mammogram will be mailed directly to the patient. RECOMMENDATION: Screening mammogram in one year. (Code:SM-B-01Y) BI-RADS CATEGORY  1: Negative. Electronically Signed   By: Lillia Mountain M.D.   On: 05/09/2016 13:09    Assessment & Plan:   Yalexa was seen today for dizziness.  Diagnoses and all orders for this visit:  Benign paroxysmal positional vertigo, unspecified laterality -     meclizine (ANTIVERT) 12.5 MG tablet; Take 1 tablet (12.5 mg total) by mouth 2 (two) times daily as needed for dizziness (do not use for more than 3days). -     ondansetron (ZOFRAN) 4 MG tablet; Take 1 tablet (4 mg total) by mouth every 8 (eight) hours as needed for nausea or vomiting. -     Ambulatory referral to [place]  Need for prophylactic vaccination with combined diphtheria-tetanus-pertussis (DTP) vaccine -     Cancel: Td vaccine greater than or equal to 7yo preservative free IM  Need for prophylactic vaccination and inoculation against influenza -     Cancel: Flu Vaccine QUAD 36+ mos IM  Dysfunction of left eustachian tube -     meclizine (ANTIVERT) 12.5 MG tablet; Take 1 tablet (12.5 mg total) by mouth 2 (two) times daily as needed for dizziness (do not use for more than 3days). -     ondansetron (ZOFRAN) 4 MG tablet; Take 1 tablet (4 mg total) by mouth every 8 (eight) hours as needed for nausea or vomiting. -     Ambulatory referral to [facility] am having Ms. Estess start on meclizine and ondansetron. I am also having her maintain her Nutritional Supplements (BOOST PLUS PO), Cetirizine HCl (ZYRTEC PO), multivitamin, triamcinolone ointment, OVER THE COUNTER MEDICATION, PREMARIN, and  clorazepate.  Meds ordered this encounter  Medications  . meclizine (ANTIVERT) 12.5 MG tablet    Sig: Take 1 tablet (12.5 mg total) by mouth 2 (two) times daily as needed for dizziness (do not use for more than 3days).    Dispense:  6 tablet    Refill:  0    Order Specific Question:   Supervising Provider    Answer:   Cassandria Anger [[id number]]  . ondansetron (ZOFRAN) 4 MG tablet    Sig: Take 1 tablet (4 mg total) by mouth every 8 (eight) hours as needed for nausea or vomiting.    Dispense:  20 tablet    Refill:  0    Order Specific Question:   Supervising Provider    Answer:   Cassandria Anger [[id number]]    Follow-up: Return if symptoms worsen or fail to improve.  Baldo Ash  Tyara Dassow, NP

## 2016-08-23 NOTE — Patient Instructions (Signed)
Change positions slowly.  Vertigo Vertigo means that you feel like you are moving when you are not. Vertigo can also make you feel like things around you are moving when they are not. This feeling can come and go at any time. Vertigo often goes away on its own. HOME CARE  Avoid making fast movements.  Avoid driving.  Avoid using heavy machinery.  Avoid doing any task or activity that might cause danger to you or other people if you would have a vertigo attack while you are doing it.  Sit down right away if you feel dizzy or have trouble with your balance.  Take over-the-counter and prescription medicines only as told by your doctor.  Follow instructions from your doctor about which positions or movements you should avoid.  Drink enough fluid to keep your pee (urine) clear or pale yellow.  Keep all follow-up visits as told by your doctor. This is important. GET HELP IF:  Medicine does not help your vertigo.  You have a fever.  Your problems get worse or you have new symptoms.  Your family or friends see changes in your behavior.  You feel sick to your stomach (nauseous) or you throw up (vomit).  You have a "pins and needles" feeling or you are numb in part of your body. GET HELP RIGHT AWAY IF:  You have trouble moving or talking.  You are always dizzy.  You pass out (faint).  You get very bad headaches.  You feel weak or have trouble using your hands, arms, or legs.  You have changes in your hearing.  You have changes in your seeing (vision).  You get a stiff neck.  Bright light starts to bother you.   This information is not intended to replace advice given to you by your health care provider. Make sure you discuss any questions you have with your health care provider.   Document Released: 07/24/2008 Document Revised: 07/06/2015 Document Reviewed: 02/07/2015 Elsevier Interactive Patient Education [employer].

## 2016-08-23 NOTE — Progress Notes (Signed)
Pre visit review using our clinic review tool, if applicable. No additional management support is needed unless otherwise documented below in the visit note. 

## 2016-08-27 ENCOUNTER — Telehealth: Payer: Self-pay | Admitting: Emergency Medicine

## 2016-08-27 NOTE — Telephone Encounter (Signed)
Pt called asked for somebody to give her a call back. She has question about home health coming out. She was unaware of what they were going to do when they came out and has questions about [facility]. Please advise thanks.

## 2016-08-28 NOTE — Telephone Encounter (Signed)
Advised patient that per referral information, home health is to come and make evaluation and then let

## 2016-08-28 NOTE — Telephone Encounter (Signed)
Home health to make evaluation and then let pcp know what orders are needed

## 2016-09-25 ENCOUNTER — Ambulatory Visit (INDEPENDENT_AMBULATORY_CARE_PROVIDER_SITE_OTHER): Payer: Medicare Other | Admitting: Internal Medicine

## 2016-09-25 ENCOUNTER — Encounter: Payer: Self-pay | Admitting: Internal Medicine

## 2016-09-25 VITALS — BP 124/64 | HR 103 | Temp 97.7°F | Resp 16 | Ht 68.0 in | Wt 123.2 lb

## 2016-09-25 DIAGNOSIS — H9313 Tinnitus, bilateral: Secondary | ICD-10-CM | POA: Insufficient documentation

## 2016-09-25 DIAGNOSIS — H6983 Other specified disorders of Eustachian tube, bilateral: Secondary | ICD-10-CM | POA: Diagnosis not present

## 2016-09-25 DIAGNOSIS — R270 Ataxia, unspecified: Secondary | ICD-10-CM

## 2016-09-25 DIAGNOSIS — H6993 Unspecified Eustachian tube disorder, bilateral: Secondary | ICD-10-CM | POA: Insufficient documentation

## 2016-09-25 MED ORDER — METHYLPREDNISOLONE 4 MG PO TBPK: ORAL_TABLET | ORAL | 0 refills | Status: DC

## 2016-09-25 NOTE — Progress Notes (Signed)
Pre visit review using our clinic review tool, if applicable. No additional management support is needed unless otherwise documented below in the visit note. 

## 2016-09-25 NOTE — Patient Instructions (Signed)

## 2016-09-25 NOTE — Progress Notes (Signed)
Subjective:  Patient ID: [name], female    DOB: 1941-10-28  Age: 75 y.o. MRN: [id number]  CC: Allergic Rhinitis    HPI GIOVANNI KOKE presents for complaints of a 2 month history of intermittent episodes of vertigo, dizziness, and ataxia. She describes a sensation of congestion in her nose and her face. She has intermittent ringing, popping and tinnitus in both ears - more on the left than the right. She has decreased since of hearing on the left side. For the dizziness and vertigo she has gotten some symptom relief with the combination of Zofran and meclizine. She denies headache, vomiting, or visual disturbance. She has had a few episodes of nausea. She denies numbness, weakness, tingling in her face arms or legs. She wants to see an ENT doctor.  Outpatient Medications Prior to Visit  Medication Sig Dispense Refill  . Cetirizine HCl (ZYRTEC PO) Take 1 tablet by mouth daily.     . clorazepate (TRANXENE) 7.5 MG tablet TAKE 1/2 TABLET AT BEDTIME AND 1/2 TABLET AS NEEDED. 60 tablet 5  . meclizine (ANTIVERT) 12.5 MG tablet Take 1 tablet (12.5 mg total) by mouth 2 (two) times daily as needed for dizziness (do not use for more than 3days). 6 tablet 0  . Nutritional Supplements (BOOST PLUS PO) Take 1 Can by mouth daily.     . ondansetron (ZOFRAN) 4 MG tablet Take 1 tablet (4 mg total) by mouth every 8 (eight) hours as needed for nausea or vomiting. 20 tablet 0  . PREMARIN 0.3 MG tablet TAKE 1 TABLET ONCE DAILY. TAKE DAILY FOR 21 DAYS THEN DONT TAKE FOR 7DAYS. 21 tablet 5  . triamcinolone ointment (KENALOG) 0.1 %   0  . Multiple Vitamin (MULTIVITAMIN) tablet Take 1 tablet by mouth daily.    Marland Kitchen OVER THE COUNTER MEDICATION TAR COMPOUND APPLY TO FEET     No facility-administered medications prior to visit.     ROS Review of Systems  Constitutional: Negative.   HENT: Positive for congestion, ear pain, hearing loss, postnasal drip, rhinorrhea and tinnitus. Negative for ear discharge, facial  swelling, sinus pain, sinus pressure, sneezing, sore throat, trouble swallowing and voice change.   Eyes: Negative.  Negative for visual disturbance.  Respiratory: Negative for cough, choking, shortness of breath and stridor.   Cardiovascular: Negative for chest pain, palpitations and leg swelling.  Gastrointestinal: Positive for nausea. Negative for abdominal pain, constipation, diarrhea and vomiting.  Endocrine: Negative.   Genitourinary: Negative.   Musculoskeletal: Positive for gait problem.  Skin: Negative.   Allergic/Immunologic: Negative.   Neurological: Positive for dizziness. Negative for tremors, seizures, syncope, facial asymmetry, speech difficulty, weakness, light-headedness, numbness and headaches.  Hematological: Negative.   Psychiatric/Behavioral: Negative.     Objective:  BP 124/64 (BP Location: Left Arm, Patient Position: Sitting, Cuff Size: Normal)   Pulse (!) 103   Temp 97.7 F (36.5 C) (Oral)   Ht 5\' 8"  (1.727 m)   Wt 123 lb 4 oz (55.9 kg)   SpO2 97%   BMI 18.74 kg/m   BP Readings from Last 3 Encounters:  09/25/16 124/64  08/23/16 118/76  04/25/16 124/80    Wt Readings from Last 3 Encounters:  09/25/16 123 lb 4 oz (55.9 kg)  08/23/16 123 lb (55.8 kg)  04/25/16 123 lb (55.8 kg)    Physical Exam  Constitutional: No distress.  HENT:  Mouth/Throat: Oropharynx is clear and moist. No oropharyngeal exudate.  Eyes: Conjunctivae are normal. Right eye exhibits no discharge.  Left eye exhibits no discharge. No scleral icterus.  Neck: Normal range of motion. Neck supple. No JVD present. No tracheal deviation present. No thyromegaly present.  Cardiovascular: Normal rate, regular rhythm, normal heart sounds and intact distal pulses.  Exam reveals no gallop and no friction rub.   No murmur heard. Pulmonary/Chest: Effort normal and breath sounds normal. No stridor. No respiratory distress. She has no wheezes. She has no rales. She exhibits no tenderness.  Abdominal:  Soft. Bowel sounds are normal. She exhibits no distension and no mass. There is no tenderness. There is no rebound and no guarding.  Musculoskeletal: Normal range of motion. She exhibits no edema, tenderness or deformity.  Lymphadenopathy:    She has no cervical adenopathy.  Neurological: She is alert. She has normal strength. She displays no atrophy, no tremor and normal reflexes. No cranial nerve deficit or sensory deficit. She exhibits normal muscle tone. She displays a negative Romberg sign. She displays no seizure activity. Coordination and gait abnormal. She displays no Babinski's sign on the right side. She displays no Babinski's sign on the left side.  Skin: Skin is warm and dry. No rash noted. She is not diaphoretic. No erythema. No pallor.  Vitals reviewed.   Lab Results  Component Value Date   WBC 8.0 04/25/2016   HGB 14.2 04/25/2016   HCT 42.5 04/25/2016   PLT 402.0 (H) 04/25/2016   GLUCOSE 100 (H) 01/11/2016   CHOL 153 01/11/2016   TRIG 80.0 01/11/2016   HDL 49.80 01/11/2016   LDLCALC 87 01/11/2016   ALT 7 04/14/2015   AST 12 04/14/2015   NA 136 01/11/2016   K 4.3 01/11/2016   CL 99 01/11/2016   CREATININE 0.80 01/11/2016   BUN 8 01/11/2016   CO2 32 01/11/2016   TSH 0.97 04/14/2015   INR 1.0 04/22/2009   HGBA1C 5.8 01/11/2016    Mm Digital Screening Bilateral  Result Date: 05/09/2016 CLINICAL DATA:  Screening. EXAM: DIGITAL SCREENING BILATERAL MAMMOGRAM WITH CAD COMPARISON:  Previous exam(s). ACR Breast Density Category c: The breast tissue is heterogeneously dense, which may obscure small masses. FINDINGS: There are no findings suspicious for malignancy. Images were processed with CAD. IMPRESSION: No mammographic evidence of malignancy. A result letter of this screening mammogram will be mailed directly to the patient. RECOMMENDATION: Screening mammogram in one year. (Code:SM-B-01Y) BI-RADS CATEGORY  1: Negative. Electronically Signed   By: Lillia Mountain M.D.   On:  05/09/2016 13:09    Assessment & Plan:   Capucine was seen today for allergic rhinitis .  Diagnoses and all orders for this visit:  Tinnitus aurium, bilateral -     Ambulatory referral to ENT  Ataxia- I have ordered an MRI of her brain to see if there is a structural cause for her signs and symptoms -     MR BRAIN WO CONTRAST; Future  Eustachian tube dysfunction, bilateral- will try a course of systemic steroids to relieve her symptoms -     methylPREDNISolone (MEDROL DOSEPAK) 4 MG TBPK tablet; TAKE AS DIRECTED   I have discontinued Ms. Deasis's multivitamin and OVER THE COUNTER MEDICATION. I am also having her start on methylPREDNISolone. Additionally, I am having her maintain her Nutritional Supplements (BOOST PLUS PO), Cetirizine HCl (ZYRTEC PO), triamcinolone ointment, PREMARIN, clorazepate, meclizine, and ondansetron.  Meds ordered this encounter  Medications  . methylPREDNISolone (MEDROL DOSEPAK) 4 MG TBPK tablet    Sig: TAKE AS DIRECTED    Dispense:  21 tablet  Refill:  0     Follow-up: Return in about 3 weeks (around 10/16/2016).  Scarlette Calico, MD

## 2016-09-26 ENCOUNTER — Other Ambulatory Visit: Payer: Self-pay | Admitting: Acute Care

## 2016-09-26 DIAGNOSIS — F17210 Nicotine dependence, cigarettes, uncomplicated: Secondary | ICD-10-CM

## 2016-10-01 ENCOUNTER — Telehealth: Payer: Self-pay | Admitting: Internal Medicine

## 2016-10-01 NOTE — Telephone Encounter (Signed)
Patient states that she is on her last day of prednisone.  Patient states she was good the first two days of prednisone.  Patient states her symptoms have come back.  Patient states she has not been called about MRI.  I will transfer patient to referral coordinators to get MRI scheduled.  Patient would like to know what to do?

## 2016-10-02 ENCOUNTER — Other Ambulatory Visit: Payer: Self-pay | Admitting: Internal Medicine

## 2016-10-02 DIAGNOSIS — H6983 Other specified disorders of Eustachian tube, bilateral: Secondary | ICD-10-CM

## 2016-10-02 MED ORDER — METHYLPREDNISOLONE 4 MG PO TBPK: ORAL_TABLET | ORAL | 0 refills | Status: DC

## 2016-10-02 NOTE — Telephone Encounter (Signed)
What symptoms is she talking about?

## 2016-10-02 NOTE — Telephone Encounter (Signed)
Ear pressure/hearing and tinnitus sx only subsided for about the first 3 days on prednisone.

## 2016-10-02 NOTE — Telephone Encounter (Signed)
Get the MRI done soon Will try another round of steroids (RX sent)

## 2016-10-03 NOTE — Telephone Encounter (Signed)
Pt informed of rx at pharmacy.   MRI is scheduled for Tuesday.

## 2016-10-09 ENCOUNTER — Ambulatory Visit
Admission: RE | Admit: 2016-10-09 | Discharge: 2016-10-09 | Disposition: A | Discharge status: Home / Self Care | Payer: Medicare Other | Source: Ambulatory Visit | Attending: Internal Medicine | Admitting: Internal Medicine

## 2016-10-09 ENCOUNTER — Encounter: Payer: Self-pay | Admitting: Internal Medicine

## 2016-10-09 DIAGNOSIS — R270 Ataxia, unspecified: Secondary | ICD-10-CM

## 2016-10-16 ENCOUNTER — Encounter: Payer: Self-pay | Admitting: Internal Medicine

## 2016-10-16 ENCOUNTER — Ambulatory Visit (INDEPENDENT_AMBULATORY_CARE_PROVIDER_SITE_OTHER): Payer: Medicare Other | Admitting: Internal Medicine

## 2016-10-16 VITALS — BP 138/80 | HR 98 | Temp 97.9°F | Resp 16 | Ht 68.0 in | Wt 120.0 lb

## 2016-10-16 DIAGNOSIS — I639 Cerebral infarction, unspecified: Secondary | ICD-10-CM | POA: Diagnosis not present

## 2016-10-16 DIAGNOSIS — I700 Atherosclerosis of aorta: Secondary | ICD-10-CM | POA: Diagnosis not present

## 2016-10-16 DIAGNOSIS — E785 Hyperlipidemia, unspecified: Secondary | ICD-10-CM | POA: Diagnosis not present

## 2016-10-16 DIAGNOSIS — I6381 Other cerebral infarction due to occlusion or stenosis of small artery: Secondary | ICD-10-CM

## 2016-10-16 MED ORDER — ASPIRIN EC 81 MG PO TBEC: 81.0000 mg | DELAYED_RELEASE_TABLET | Freq: Every day | ORAL | 3 refills | Status: AC

## 2016-10-16 NOTE — Progress Notes (Signed)
Subjective:  Patient ID: [name], female    DOB: 1941/06/27  Age: 75 y.o. MRN: [id number]  CC: Hyperlipidemia   HPI LAURY NYDEGGER presents for concerns about a recent MRI that showed an old lacunar infarct, she has a hx of ataxia but no neuro complaints recently. She saw an ENT doctor recently who asked her to use flonase for her nasal sx's.  Outpatient Medications Prior to Visit  Medication Sig Dispense Refill  . Cetirizine HCl (ZYRTEC PO) Take 1 tablet by mouth daily.     . clorazepate (TRANXENE) 7.5 MG tablet TAKE 1/2 TABLET AT BEDTIME AND 1/2 TABLET AS NEEDED. 60 tablet 5  . meclizine (ANTIVERT) 12.5 MG tablet Take 1 tablet (12.5 mg total) by mouth 2 (two) times daily as needed for dizziness (do not use for more than 3days). 6 tablet 0  . Nutritional Supplements (BOOST PLUS PO) Take 1 Can by mouth daily.     . ondansetron (ZOFRAN) 4 MG tablet Take 1 tablet (4 mg total) by mouth every 8 (eight) hours as needed for nausea or vomiting. 20 tablet 0  . PREMARIN 0.3 MG tablet TAKE 1 TABLET ONCE DAILY. TAKE DAILY FOR 21 DAYS THEN DONT TAKE FOR 7DAYS. 21 tablet 5  . triamcinolone ointment (KENALOG) 0.1 %   0  . methylPREDNISolone (MEDROL DOSEPAK) 4 MG TBPK tablet TAKE AS DIRECTED 21 tablet 0   No facility-administered medications prior to visit.     ROS Review of Systems  HENT: Positive for congestion, postnasal drip and rhinorrhea. Negative for facial swelling, sinus pain, sinus pressure, sneezing, sore throat and trouble swallowing.   Eyes: Negative for visual disturbance.  Respiratory: Negative for cough, chest tightness, shortness of breath and stridor.   Cardiovascular: Negative for chest pain, palpitations and leg swelling.  Gastrointestinal: Negative for abdominal pain, blood in stool, constipation, diarrhea, nausea and vomiting.  Endocrine: Negative.   Genitourinary: Negative.  Negative for difficulty urinating.  Musculoskeletal: Negative.  Negative for gait problem  and joint swelling.  Skin: Negative.   Allergic/Immunologic: Negative.   Neurological: Negative.  Negative for dizziness, tremors, speech difficulty, weakness, light-headedness, numbness and headaches.  Hematological: Negative.  Negative for adenopathy. Does not bruise/bleed easily.  Psychiatric/Behavioral: Negative.     Objective:  BP 138/80 (BP Location: Left Arm, Patient Position: Sitting, Cuff Size: Small)   Pulse 98   Temp 97.9 F (36.6 C) (Oral)   Resp 16   Ht 5\' 8"  (1.727 m)   Wt 120 lb (54.4 kg)   SpO2 95%   BMI 18.25 kg/m   BP Readings from Last 3 Encounters:  10/16/16 138/80  09/25/16 124/64  08/23/16 118/76    Wt Readings from Last 3 Encounters:  10/16/16 120 lb (54.4 kg)  09/25/16 123 lb 4 oz (55.9 kg)  08/23/16 123 lb (55.8 kg)    Physical Exam  Constitutional: She is oriented to person, place, and time. She appears well-developed and well-nourished. No distress.  HENT:  Mouth/Throat: Oropharynx is clear and moist. No oropharyngeal exudate.  Eyes: Conjunctivae are normal. Right eye exhibits no discharge. Left eye exhibits no discharge. No scleral icterus.  Neck: Normal range of motion. Neck supple. No JVD present. No tracheal deviation present. No thyromegaly present.  Cardiovascular: Normal rate, regular rhythm, normal heart sounds and intact distal pulses.  Exam reveals no gallop and no friction rub.   No murmur heard. Pulmonary/Chest: Effort normal and breath sounds normal. No stridor. No respiratory distress. She has no  wheezes. She has no rales. She exhibits no tenderness.  Abdominal: Soft. Bowel sounds are normal. She exhibits no distension and no mass. There is no tenderness. There is no rebound and no guarding.  Musculoskeletal: Normal range of motion. She exhibits no edema, tenderness or deformity.  Lymphadenopathy:    She has no cervical adenopathy.  Neurological: She is alert and oriented to person, place, and time. She has normal reflexes. She  displays no atrophy, no tremor and normal reflexes. No cranial nerve deficit or sensory deficit. She exhibits normal muscle tone. She displays no seizure activity. Coordination and gait normal.  Skin: Skin is warm and dry. No rash noted. She is not diaphoretic. No erythema. No pallor.  Psychiatric: She has a normal mood and affect. Her behavior is normal. Judgment and thought content normal.  Vitals reviewed.   Lab Results  Component Value Date   WBC 8.0 04/25/2016   HGB 14.2 04/25/2016   HCT 42.5 04/25/2016   PLT 402.0 (H) 04/25/2016   GLUCOSE 100 (H) 01/11/2016   CHOL 153 01/11/2016   TRIG 80.0 01/11/2016   HDL 49.80 01/11/2016   LDLCALC 87 01/11/2016   ALT 7 04/14/2015   AST 12 04/14/2015   NA 136 01/11/2016   K 4.3 01/11/2016   CL 99 01/11/2016   CREATININE 0.80 01/11/2016   BUN 8 01/11/2016   CO2 32 01/11/2016   TSH 0.97 04/14/2015   INR 1.0 04/22/2009   HGBA1C 5.8 01/11/2016    Mr Brain Wo Contrast  Result Date: 10/09/2016 CLINICAL DATA:  75 year old female with ataxia.   Initial encounter. EXAM: MRI HEAD WITHOUT CONTRAST TECHNIQUE: Multiplanar, multiecho pulse sequences of the brain and surrounding structures were obtained without intravenous contrast. COMPARISON:  Brain MRI 06/19/2013. FINDINGS: Brain: No restricted diffusion to suggest acute infarction. No midline shift, mass effect, evidence of mass lesion, ventriculomegaly, extra-axial collection or acute intracranial hemorrhage. Cervicomedullary junction and pituitary are within normal limits. Stable cerebral volume since 2014, normal for age. Patchy chronic bilateral cerebral white matter T2 and FLAIR hyperintensity, with involvement of the external capsules more so the right. Patchy and confluent T2 hyperintensity in the pons appears mildly progressed (series 6, image 6). No cortical encephalomalacia or chronic cerebral blood products identified. Chronic lacunar infarct left thalamus is stable. Cerebellum remains  normal. Vascular: Major intracranial vascular flow voids are stable. Mild chronic intracranial artery dolichoectasia. Incidental pneumatized right anterior clinoid process. Skull and upper cervical spine: Negative. Normal bone marrow signal. Sinuses/Orbits: Normal orbits soft tissues. Visualized paranasal sinuses and mastoids are stable and well pneumatized. Other: Visible internal auditory structures appear normal. Negative scalp soft tissues. IMPRESSION: 1.  No acute intracranial abnormality. 2. Chronic nonspecific signal changes in the cerebral white matter and pons, with mild progression in the latter since 2014. In light of stable chronic lacunar infarct left thalamus, these signal changes are most likely small vessel disease related. Electronically Signed   By: Genevie Ann M.D.   On: 10/09/2016 13:26    Assessment & Plan:   Monyette was seen today for hyperlipidemia.  Diagnoses and all orders for this visit:  Left sided lacunar infarction [facility])- we discussed ways to prevent another CVA, she will try to quit smoking, she will not take a statin or ACEI, will start asa -     aspirin EC 81 MG tablet; Take 1 tablet (81 mg total) by mouth daily.  Atherosclerosis of aorta ([place])- will start as for CV risk reduction -  aspirin EC 81 MG tablet; Take 1 tablet (81 mg total) by mouth daily.  Hyperlipidemia with target LDL less than 130- Framingham risk score is 6%, she is not willing to start a statin   I have discontinued Ms. Monteith's methylPREDNISolone. I am also having her start on aspirin EC. Additionally, I am having her maintain her Nutritional Supplements (BOOST PLUS PO), Cetirizine HCl (ZYRTEC PO), triamcinolone ointment, PREMARIN, clorazepate, meclizine, ondansetron, and fluticasone.  Meds ordered this encounter  Medications  . fluticasone (FLONASE) 50 MCG/ACT nasal spray    Refill:  4  . aspirin EC 81 MG tablet    Sig: Take 1 tablet (81 mg total) by mouth daily.    Dispense:  90 tablet     Refill:  3     Follow-up: Return in about 6 months (around 04/16/2017).  Scarlette Calico, MD

## 2016-10-16 NOTE — Progress Notes (Signed)
Pre visit review using our clinic review tool, if applicable. No additional management support is needed unless otherwise documented below in the visit note. 

## 2016-10-16 NOTE — Patient Instructions (Signed)
Ischemic Stroke An ischemic stroke is the sudden death of brain tissue. Blood carries oxygen to all areas of the body. This type of stroke happens when your blood does not flow to your brain like normal. Your brain cannot get the oxygen it needs. This is an emergency. It must be treated right away. Symptoms of a stroke usually happen all of a sudden. You may notice them when you wake up. They can include:  Weakness or loss of feeling in your face, arm, or leg. This often happens on one side of the body.  Trouble walking.  Trouble moving your arms or legs.  Loss of balance or coordination.  Feeling confused.  Trouble talking or understanding what people are saying.  Slurred speech.  Trouble seeing.  Seeing two of one object (double vision).  Feeling dizzy.  Feeling sick to your stomach (nauseous) and throwing up (vomiting).  A very bad headache for no reason. Get help as soon as any of these problems start. This is important. Some treatments work better if they are given right away. These include:  Aspirin.  Medicines to control blood pressure.  A shot (injection) of medicine to break up the blood clot.  Treatments given in the blood vessel (artery) to take out the clot or break it up. Other treatments may include:  Oxygen.  Fluids given through an IV tube.  Medicines to thin out your blood.  Procedures to help your blood flow better. What increases the risk? Certain things may make you more likely to have a stroke. Some of these are things that you can change, such as:  Being very overweight (obesity).  Smoking.  Taking birth control pills.  Not being active.  Drinking too much alcohol.  Using drugs. Other risk factors include:  High blood pressure.  High cholesterol.  Diabetes.  Heart disease.  Being African American, Native American, Hispanic, or [place] Native.  Being over age 60.  Family history of stroke.  Having had blood clots, stroke,  or warning stroke (transient ischemic attack, TIA) in the past.  Sickle cell disease.  Being a woman with a history of high blood pressure in pregnancy (preeclampsia).  Migraine headache.  Sleep apnea.  Having an irregular heartbeat (atrial fibrillation).  Long-term (chronic) diseases that cause soreness and swelling (inflammation).  Disorders that affect how your blood clots. Follow these instructions at home: Medicines  Take over-the-counter and prescription medicines only as told by your doctor.  If you were told to take aspirin or another medicine to thin your blood, take it exactly as told by your doctor.  Taking too much of the medicine can cause bleeding.  If you do not take enough, it may not work as well.  Know the side effects of your medicines. If you are taking a blood thinner, make sure you:  Hold pressure over any cuts for longer than usual.  Tell your dentist and other doctors that you take this medicine.  Avoid activities that may cause damage or injury to your body. Eating and drinking  Follow instructions from your doctor about what you cannot eat or drink.  Eat healthy foods.  If you have trouble with swallowing, do these things to avoid choking:  Take small bites when eating.  Eat foods that are soft or pureed. Safety  Follow instructions from your health care team about physical activity.  Use a walker or cane as told by your doctor.  Keep your home safe so you do not fall. This   may include:  Having experts look at your home to make sure it is safe.  Putting grab bars in the bedroom and bathroom.  Using raised toilets.  Putting a seat in the shower. General instructions  Do not use any tobacco products.  Examples of these are cigarettes, chewing tobacco, and e-cigarettes.  If you need help quitting, ask your doctor.  Limit how much alcohol you drink. This means no more than 1 drink a day for nonpregnant women and 2 drinks a day  for men. One drink equals 12 oz of beer, 5 oz of wine, or 1 oz of hard liquor.  If you need help to stop using drugs or alcohol, ask your doctor to refer you to a program or specialist.  Stay active. Exercise as told by your doctor.  Keep all follow-up visits as told by your doctor. This is important. Get help right away if:  You suddenly:  Have weakness or loss of feeling in your face, arm, or leg.  Feel confused.  Have trouble talking or understanding what people are saying.  Have trouble seeing.  Have trouble walking.  Have trouble moving your arms or legs.  Feel dizzy.  Lose your balance or coordination.  Have a very bad headache and you do not know why.  You pass out (lose consciousness) or almost pass out.  You have jerky movements that you cannot control (seizure). These symptoms may be an emergency. Do not wait to see if the symptoms will go away. Get medical help right away. Call your local emergency services (911 in the [place] Do not drive yourself to the hospital.  This information is not intended to replace advice given to you by your health care provider. Make sure you discuss any questions you have with your health care provider. Document Released: 10/04/2011 Document Revised: 03/27/2016 Document Reviewed: 01/11/2016 Elsevier Interactive Patient Education  2017 [employer]  

## 2016-10-29 IMAGING — CT CT CHEST LUNG CANCER SCREENING LOW DOSE W/O CM
2 of 4 series · 15 of 40 positions shown, 18 images · non-contrast
Comparison: 10/06/2012.

CLINICAL DATA: Current smoker, 55 pack-year history, lung cancer
screening.

EXAM:
CT CHEST WITHOUT CONTRAST LOW-DOSE FOR LUNG CANCER SCREENING
TECHNIQUE: Multidetector CT imaging of the chest was performed following the
standard protocol without IV contrast.

[Series 2: thorax 5.0 i31f 3 · axial · 0.62mm/px · z∈[-302,-27]mm · 12 of 66 slices shown, 15 images]
[im 6/66  mediastinal]
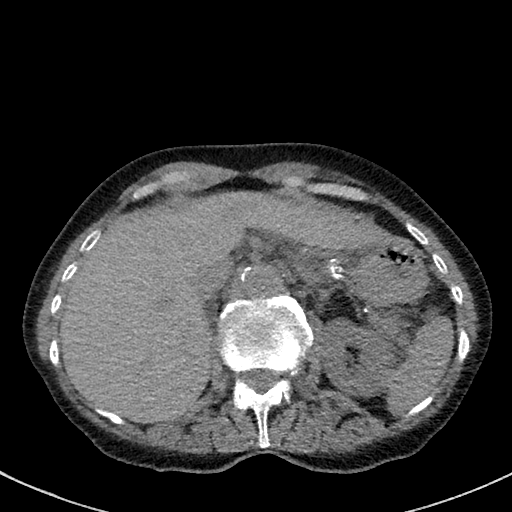
[im 6/66  lung]
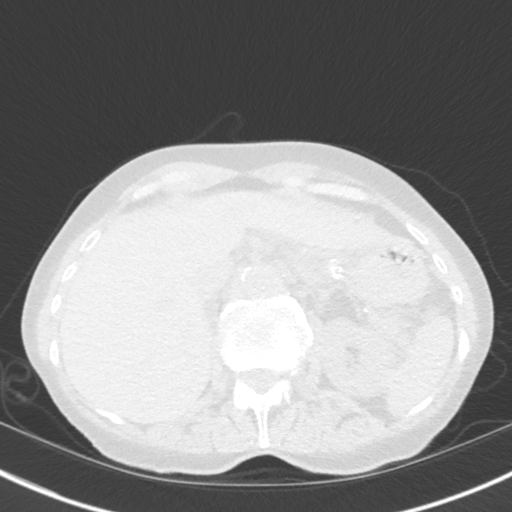
[im 11/66  lung]
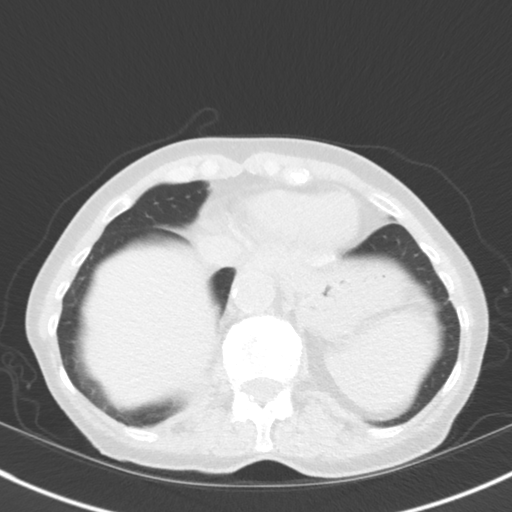
[im 16/66  lung]
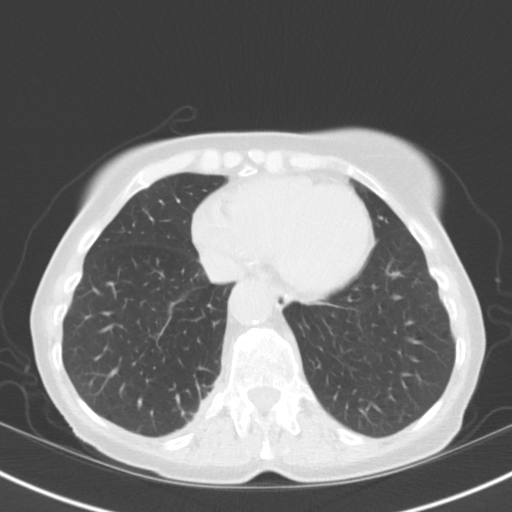
[im 21/66  lung]
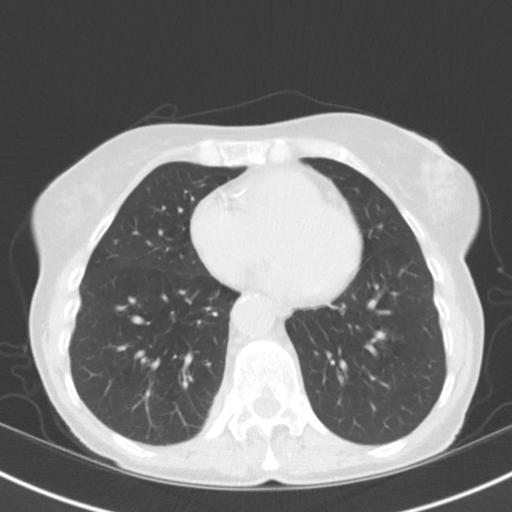
[im 26/66  mediastinal]
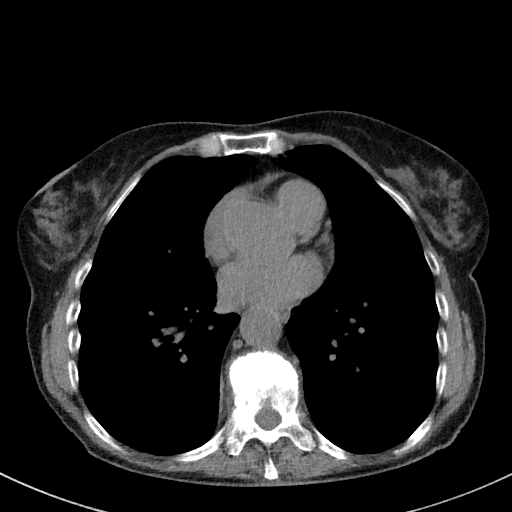
[im 26/66  lung]
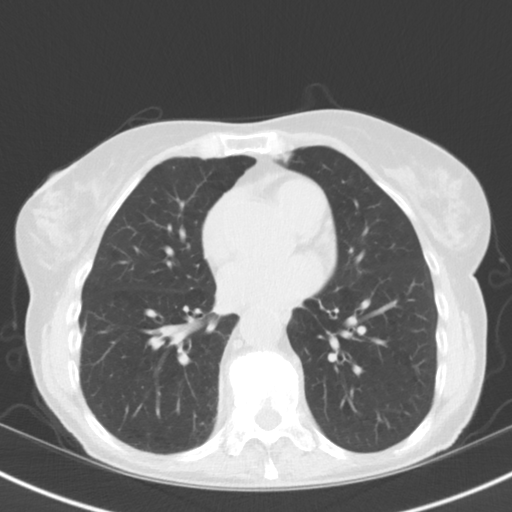
[im 31/66  lung]
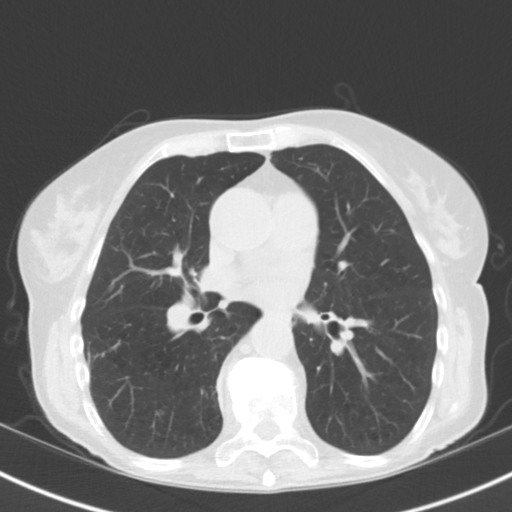
[im 36/66  lung]
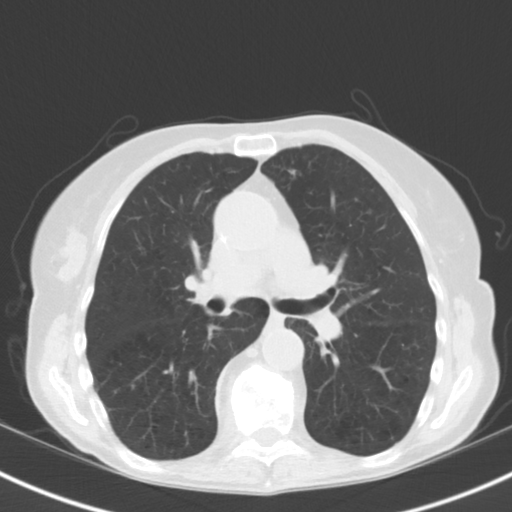
[im 41/66  lung]
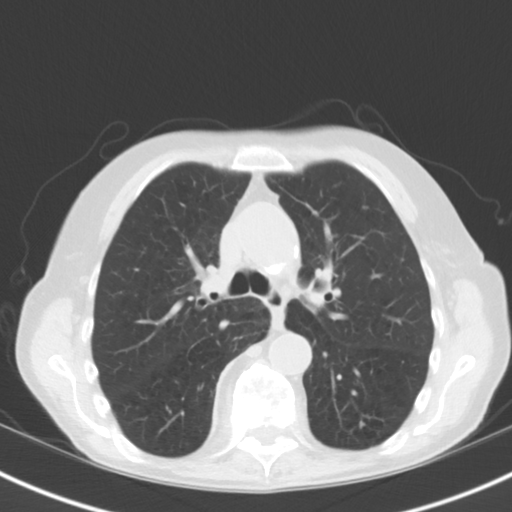
[im 46/66  mediastinal]
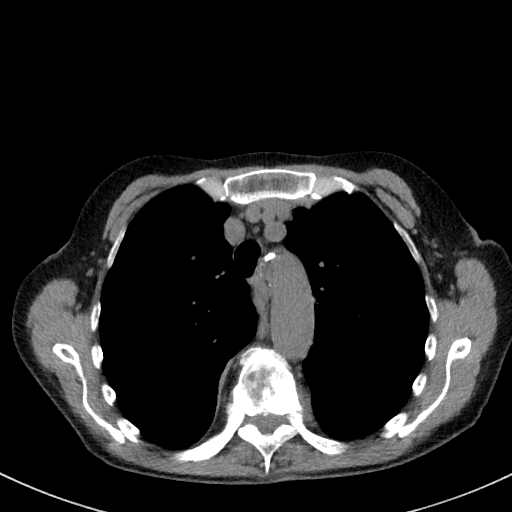
[im 46/66  lung]
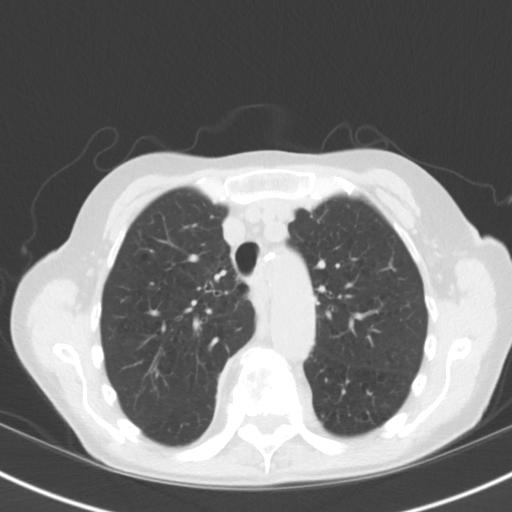
[im 51/66  lung]
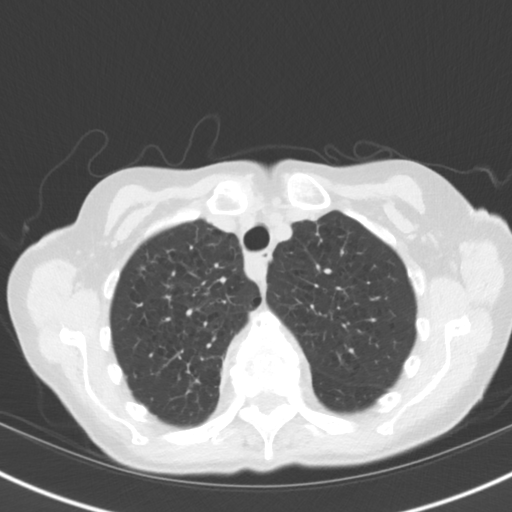
[im 56/66  lung]
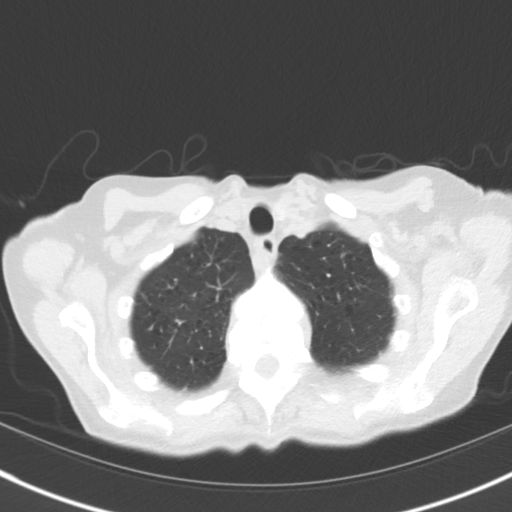
[im 61/66  lung]
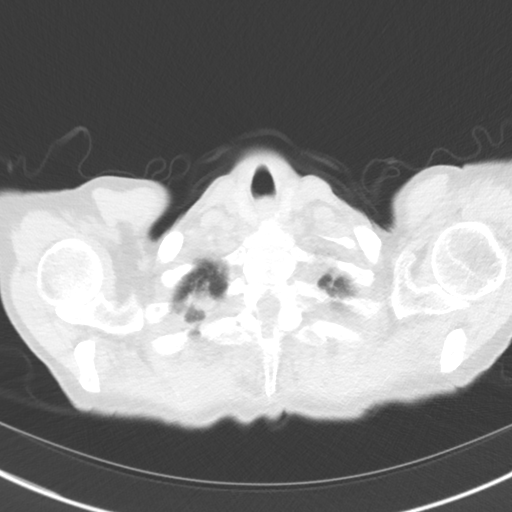

[Series 5: coronal · coronal · 0.54mm/px · 3 of 118 slices shown]
[im 24/118  lung]
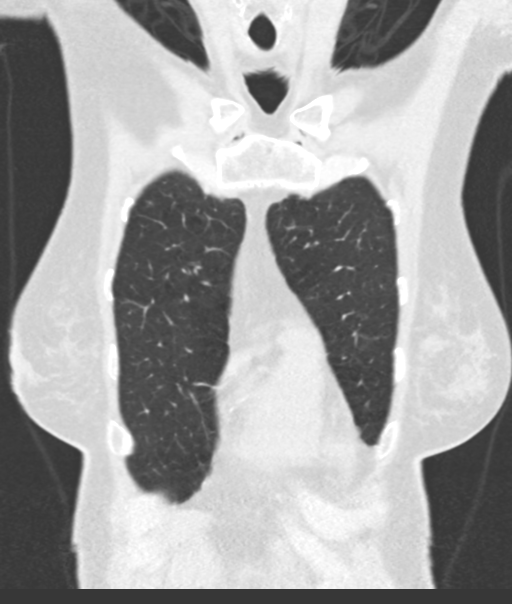
[im 47/118  lung]
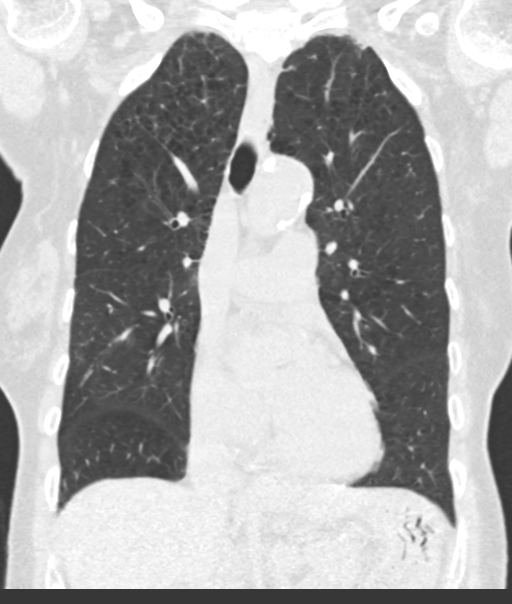
[im 71/118  lung]
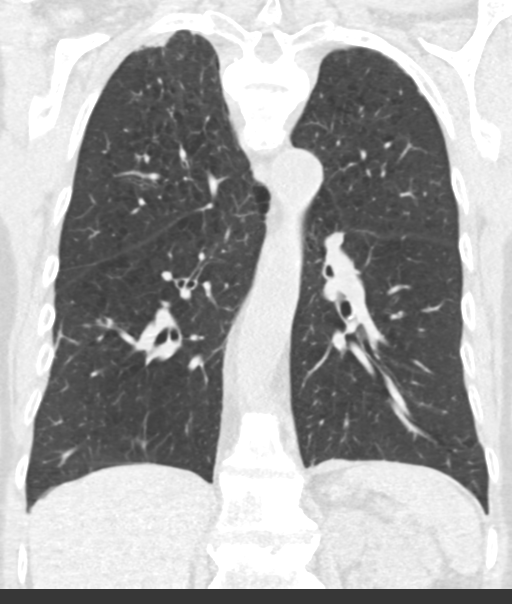

[15 of 40 positions shown; findings below may reference images not displayed]

FINDINGS: Mediastinum/Nodes: Aberrant right subclavian artery is incidentally
noted. No pathologically enlarged mediastinal or axillary lymph
nodes. Hilar regions are difficult to definitively evaluate without
IV contrast. Coronary artery calcification. Heart size normal. No
pericardial effusion.

Lungs/Pleura: Moderate centrilobular emphysema. Biapical pleural
parenchymal scarring. No worrisome pulmonary nodules. No pleural
fluid. Airway is unremarkable.

Upper abdomen: Visualized portion of the liver is unremarkable. 12
mm fluid density nodule in the right adrenal gland. Visualized
portions of the left adrenal gland, left kidney, spleen, pancreas
and stomach are grossly unremarkable.

Musculoskeletal: Flowing anterior osteophytosis in the thoracic
spine with multilevel degenerative disc disease.
IMPRESSION: 1. Lung-RADS Category 1, negative. Continue annual screening with
low-dose chest CT without contrast in 12 months.
2. Coronary artery calcification.
3. Right adrenal adenoma.

## 2016-10-30 ENCOUNTER — Other Ambulatory Visit: Payer: Self-pay | Admitting: Internal Medicine

## 2016-11-05 ENCOUNTER — Ambulatory Visit: Payer: Medicare Other | Admitting: Internal Medicine

## 2017-01-02 ENCOUNTER — Telehealth: Payer: Self-pay

## 2017-01-02 NOTE — Telephone Encounter (Signed)
Called patient and informed her of the notes. She said thank you.

## 2017-01-02 NOTE — Telephone Encounter (Signed)
PA was approved.  Will you call patient and let her know she can call the pharmacy when she is ready to have it filled.

## 2017-01-02 NOTE — Telephone Encounter (Signed)
PA started via covermymeds. Key: [id number]

## 2017-01-28 ENCOUNTER — Ambulatory Visit (INDEPENDENT_AMBULATORY_CARE_PROVIDER_SITE_OTHER)
Admission: RE | Admit: 2017-01-28 | Discharge: 2017-01-28 | Disposition: A | Discharge status: Home / Self Care | Payer: Medicare Other | Source: Ambulatory Visit | Attending: Acute Care | Admitting: Acute Care

## 2017-01-28 DIAGNOSIS — F17210 Nicotine dependence, cigarettes, uncomplicated: Secondary | ICD-10-CM

## 2017-01-28 DIAGNOSIS — Z87891 Personal history of nicotine dependence: Secondary | ICD-10-CM | POA: Diagnosis not present

## 2017-01-30 ENCOUNTER — Other Ambulatory Visit: Payer: Self-pay | Admitting: Acute Care

## 2017-01-30 DIAGNOSIS — F17210 Nicotine dependence, cigarettes, uncomplicated: Principal | ICD-10-CM

## 2017-02-25 ENCOUNTER — Other Ambulatory Visit: Payer: Self-pay | Admitting: Internal Medicine

## 2017-02-25 NOTE — Telephone Encounter (Signed)
rx faxed to [facility]

## 2017-02-26 ENCOUNTER — Telehealth: Payer: Self-pay

## 2017-02-26 NOTE — Telephone Encounter (Signed)
Routing to dr jones---patient has bone density tscore 2.9 and diagnosis of osteoporosis, are you ok with patient starting prolia injections---please advise, I will call patient back, thanks

## 2017-02-27 NOTE — Telephone Encounter (Signed)
yes

## 2017-02-27 NOTE — Telephone Encounter (Signed)
Jonelle Sidle will be submitting information for prolia injections to patient's insurance and will call patient back to discuss

## 2017-04-03 NOTE — Telephone Encounter (Signed)
Patient would like to think about getting prolia injections, has appt with dr Ronnald Ramp on 6/18 and would like to get prolia pamphlet----I have verified insurance for prolia, with estimated $0 copay due should patient decide to start prolia injections---I have added note to patient's appt date for dr Ronnald Ramp to discuss with patient

## 2017-04-15 ENCOUNTER — Ambulatory Visit (INDEPENDENT_AMBULATORY_CARE_PROVIDER_SITE_OTHER): Payer: Medicare Other | Admitting: Internal Medicine

## 2017-04-15 ENCOUNTER — Encounter: Payer: Self-pay | Admitting: Internal Medicine

## 2017-04-15 ENCOUNTER — Other Ambulatory Visit (INDEPENDENT_AMBULATORY_CARE_PROVIDER_SITE_OTHER): Payer: Medicare Other

## 2017-04-15 VITALS — BP 120/78 | HR 91 | Temp 98.0°F | Resp 16 | Ht 68.0 in | Wt 114.0 lb

## 2017-04-15 DIAGNOSIS — E785 Hyperlipidemia, unspecified: Secondary | ICD-10-CM | POA: Diagnosis not present

## 2017-04-15 DIAGNOSIS — D75839 Thrombocytosis, unspecified: Secondary | ICD-10-CM

## 2017-04-15 DIAGNOSIS — D473 Essential (hemorrhagic) thrombocythemia: Secondary | ICD-10-CM

## 2017-04-15 DIAGNOSIS — M818 Other osteoporosis without current pathological fracture: Secondary | ICD-10-CM | POA: Diagnosis not present

## 2017-04-15 DIAGNOSIS — I700 Atherosclerosis of aorta: Secondary | ICD-10-CM

## 2017-04-15 LAB — CBC WITH DIFFERENTIAL/PLATELET
BASOS ABS: 0.1 10*3/uL (ref 0.0–0.1)
Basophils Relative: 1.3 % (ref 0.0–3.0)
EOS ABS: 0 10*3/uL (ref 0.0–0.7)
Eosinophils Relative: 0.3 % (ref 0.0–5.0)
HCT: 43.5 % (ref 36.0–46.0)
HEMOGLOBIN: 14.4 g/dL (ref 12.0–15.0)
LYMPHS ABS: 1.4 10*3/uL (ref 0.7–4.0)
Lymphocytes Relative: 23.9 % (ref 12.0–46.0)
MCHC: 33.1 g/dL (ref 30.0–36.0)
MCV: 86.7 fl (ref 78.0–100.0)
MONO ABS: 0.3 10*3/uL (ref 0.1–1.0)
Monocytes Relative: 5.2 % (ref 3.0–12.0)
NEUTROS PCT: 69.3 % (ref 43.0–77.0)
Neutro Abs: 4 10*3/uL (ref 1.4–7.7)
Platelets: 361 10*3/uL (ref 150.0–400.0)
RBC: 5.02 Mil/uL (ref 3.87–5.11)
RDW: 13.9 % (ref 11.5–15.5)
WBC: 5.8 10*3/uL (ref 4.0–10.5)

## 2017-04-15 LAB — VITAMIN D 25 HYDROXY (VIT D DEFICIENCY, FRACTURES): VITD: 39.71 ng/mL (ref 30.00–100.00)

## 2017-04-15 LAB — COMPREHENSIVE METABOLIC PANEL
ALBUMIN: 4.1 g/dL (ref 3.5–5.2)
ALT: 6 U/L (ref 0–35)
AST: 12 U/L (ref 0–37)
Alkaline Phosphatase: 57 U/L (ref 39–117)
BILIRUBIN TOTAL: 0.4 mg/dL (ref 0.2–1.2)
BUN: 11 mg/dL (ref 6–23)
CO2: 29 mEq/L (ref 19–32)
Calcium: 9.6 mg/dL (ref 8.4–10.5)
Chloride: 98 mEq/L (ref 96–112)
Creatinine, Ser: 0.78 mg/dL (ref 0.40–1.20)
GFR: 76.51 mL/min (ref >60.00)
GLUCOSE: 107 mg/dL — AB (ref 70–99)
POTASSIUM: 4.1 meq/L (ref 3.5–5.1)
SODIUM: 133 meq/L — AB (ref 135–145)
Total Protein: 7.5 g/dL (ref 6.0–8.3)

## 2017-04-15 LAB — LIPID PANEL
CHOLESTEROL: 138 mg/dL (ref 0–200)
HDL: 47.1 mg/dL (ref >39.00)
LDL Cholesterol: 78 mg/dL (ref 0–99)
NONHDL: 90.96
Total CHOL/HDL Ratio: 3
Triglycerides: 64 mg/dL (ref 0.0–149.0)
VLDL: 12.8 mg/dL (ref 0.0–40.0)

## 2017-04-15 LAB — TSH: TSH: 0.68 u[IU]/mL (ref 0.35–4.50)

## 2017-04-15 NOTE — Progress Notes (Signed)
Subjective:  Patient ID: [name], female    DOB: [birth year]  Age: 76 y.o. MRN: [id number]  CC: Hyperlipidemia   HPI MALAYAH DEMURO presents for f/up - She feels well today and offers no complaints. She tells me she is not willing to treat her osteoporosis. She has tried multiple oral agents in the past and did not like the side effects and is not willing to do an injection like Forteo or Prolia.  Outpatient Medications Prior to Visit  Medication Sig Dispense Refill  . aspirin EC 81 MG tablet Take 1 tablet (81 mg total) by mouth daily. 90 tablet 3  . Cetirizine HCl (ZYRTEC PO) Take 1 tablet by mouth daily.     . clorazepate (TRANXENE) 7.5 MG tablet TAKE 1/2 TABLET AT BEDTIME AND 1/2 TABLET AS NEEDED. 60 tablet 5  . fluticasone (FLONASE) 50 MCG/ACT nasal spray   4  . Nutritional Supplements (BOOST PLUS PO) Take 1 Can by mouth daily.     Marland Kitchen PREMARIN 0.3 MG tablet TAKE 1 TABLET ONCE DAILY. TAKE DAILY FOR 21 DAYS THEN DONT TAKE FOR 7DAYS. 21 tablet 11  . triamcinolone ointment (KENALOG) 0.1 %   0  . meclizine (ANTIVERT) 12.5 MG tablet Take 1 tablet (12.5 mg total) by mouth 2 (two) times daily as needed for dizziness (do not use for more than 3days). 6 tablet 0  . ondansetron (ZOFRAN) 4 MG tablet Take 1 tablet (4 mg total) by mouth every 8 (eight) hours as needed for nausea or vomiting. 20 tablet 0   No facility-administered medications prior to visit.     ROS Review of Systems  Constitutional: Negative.  Negative for appetite change, diaphoresis, fatigue and unexpected weight change.  HENT: Negative.  Negative for trouble swallowing.   Eyes: Negative for visual disturbance.  Respiratory: Negative.  Negative for cough, choking and shortness of breath.   Cardiovascular: Negative.  Negative for chest pain, palpitations and leg swelling.  Gastrointestinal: Negative.  Negative for abdominal distention, abdominal pain, constipation, diarrhea, nausea and vomiting.  Endocrine: Negative.    Genitourinary: Negative.  Negative for difficulty urinating.  Musculoskeletal: Negative.  Negative for back pain and myalgias.  Skin: Negative.   Allergic/Immunologic: Negative.   Neurological: Negative.  Negative for dizziness, weakness and numbness.  Hematological: Negative.  Negative for adenopathy. Does not bruise/bleed easily.  Psychiatric/Behavioral: Negative.     Objective:  BP 120/78 (BP Location: Left Arm, Patient Position: Sitting, Cuff Size: Normal)   Pulse 91   Temp 98 F (36.7 C) (Oral)   Resp 16   Ht 5\' 8"  (1.727 m)   Wt 114 lb (51.7 kg)   SpO2 97%   BMI 17.33 kg/m   BP Readings from Last 3 Encounters:  04/15/17 120/78  10/16/16 138/80  09/25/16 124/64    Wt Readings from Last 3 Encounters:  04/15/17 114 lb (51.7 kg)  10/16/16 120 lb (54.4 kg)  09/25/16 123 lb 4 oz (55.9 kg)    Physical Exam  Constitutional: She is oriented to person, place, and time.  HENT:  Mouth/Throat: Oropharynx is clear and moist. No oropharyngeal exudate.  Eyes: Conjunctivae are normal. Right eye exhibits no discharge. Left eye exhibits no discharge. No scleral icterus.  Neck: Normal range of motion. Neck supple. No JVD present. No thyromegaly present.  Cardiovascular: Normal rate, regular rhythm and intact distal pulses.  Exam reveals no gallop.   No murmur heard. Pulmonary/Chest: Effort normal and breath sounds normal. No respiratory distress. She  has no wheezes. She has no rales. She exhibits no tenderness.  Abdominal: Soft. Bowel sounds are normal. She exhibits no distension and no mass. There is no tenderness. There is no rebound and no guarding.  Musculoskeletal: Normal range of motion. She exhibits no edema.  Lymphadenopathy:    She has no cervical adenopathy.  Neurological: She is alert and oriented to person, place, and time.  Skin: Skin is warm and dry. No rash noted. She is not diaphoretic. No erythema. No pallor.  Vitals reviewed.   Lab Results  Component Value  Date   WBC 5.8 04/15/2017   HGB 14.4 04/15/2017   HCT 43.5 04/15/2017   PLT 361.0 04/15/2017   GLUCOSE 107 (H) 04/15/2017   CHOL 138 04/15/2017   TRIG 64.0 04/15/2017   HDL 47.10 04/15/2017   LDLCALC 78 04/15/2017   ALT 6 04/15/2017   AST 12 04/15/2017   NA 133 (L) 04/15/2017   K 4.1 04/15/2017   CL 98 04/15/2017   CREATININE 0.78 04/15/2017   BUN 11 04/15/2017   CO2 29 04/15/2017   TSH 0.68 04/15/2017   INR 1.0 04/22/2009   HGBA1C 5.8 01/11/2016    Ct Chest Lung Cancer Screening Low Dose Wo Contrast  Result Date: 01/28/2017 CLINICAL DATA:  76 year old asymptomatic female current smoker with 55 pack-year smoking history. EXAM: CT CHEST WITHOUT CONTRAST LOW-DOSE FOR LUNG CANCER SCREENING TECHNIQUE: Multidetector CT imaging of the chest was performed following the standard protocol without IV contrast. COMPARISON:  01/25/2016 screening chest CT. FINDINGS: Cardiovascular: Normal heart size. No significant pericardial fluid/thickening. Left anterior descending and right coronary atherosclerosis. Atherosclerotic nonaneurysmal thoracic aorta. 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 Normal caliber pulmonary arteries. Mediastinum/Nodes: No discrete thyroid nodules. Unremarkable esophagus. No pathologically enlarged axillary, mediastinal or gross hilar lymph nodes, noting limited sensitivity for the detection of hilar adenopathy on this noncontrast study. Lungs/Pleura: No pneumothorax. No pleural effusion. Stable mild smooth pleural thickening in the medial dependent right pleural space. Mild to moderate centrilobular emphysema with diffuse bronchial wall thickening. There are a few scattered small pulmonary nodules in both lungs, none of which have significantly grown in the interval. No acute consolidative airspace disease or new significant pulmonary nodules. Upper abdomen: Unremarkable. Musculoskeletal: No aggressive appearing  focal osseous lesions. Marked thoracic spondylosis. IMPRESSION: 1. Lung-RADS Category 2-S, benign appearance or behavior. Continue annual screening with low-dose chest CT without contrast in 12 months. 2. The "S" modifier above refers to potentially clinically significant non lung cancer related findings. Specifically, two-vessel coronary atherosclerosis. 3. Aortic atherosclerosis. 4. 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 5. Mild to moderate emphysema with diffuse bronchial wall thickening, suggesting COPD. Electronically Signed   By: Ilona Sorrel M.D.   On: 01/28/2017 13:02    Assessment & Plan:   Ronnita was seen today for hyperlipidemia.  Diagnoses and all orders for this visit:  Atherosclerosis of aorta ([place])- her Framingham risk score is 8 but she is not willing to take a statin, will continue daily baby aspirin for risk reduction -     Lipid panel; Future  Other osteoporosis without current pathological fracture- she will maintain a normal vitamin D level but she is not willing to treat this. -     VITAMIN D 25 Hydroxy (Vit-D Deficiency, Fractures); Future  Hyperlipidemia with target LDL less than 130- her Framingham risk score is 8%, she is not willing to take a statin. -  Lipid panel; Future -     Comprehensive metabolic panel; Future -     TSH; Future  Thrombocytosis ([place])- her platelet count is normal now, will continue to monitor this. -     Comprehensive metabolic panel; Future -     CBC with Differential/Platelet; Future   I have discontinued Ms. Stoneking's meclizine and ondansetron. I am also having her maintain her Nutritional Supplements (BOOST PLUS PO), Cetirizine HCl (ZYRTEC PO), triamcinolone ointment, fluticasone, aspirin EC, PREMARIN, and clorazepate.  No orders of the defined types were placed in this encounter.    Follow-up: Return in about 6 months (around 10/15/2017), or if symptoms worsen or fail to improve.  Scarlette Calico, MD

## 2017-04-15 NOTE — Patient Instructions (Addendum)

## 2017-06-21 ENCOUNTER — Ambulatory Visit (INDEPENDENT_AMBULATORY_CARE_PROVIDER_SITE_OTHER): Payer: Medicare Other | Admitting: Family Medicine

## 2017-06-21 ENCOUNTER — Encounter: Payer: Self-pay | Admitting: Family Medicine

## 2017-06-21 VITALS — BP 138/82 | HR 86 | Temp 97.6°F | Ht 68.0 in | Wt 111.0 lb

## 2017-06-21 DIAGNOSIS — Z23 Encounter for immunization: Secondary | ICD-10-CM

## 2017-06-21 DIAGNOSIS — T148XXA Other injury of unspecified body region, initial encounter: Secondary | ICD-10-CM | POA: Diagnosis not present

## 2017-06-21 NOTE — Assessment & Plan Note (Signed)
Findings on ultrasound and exam are consistent with hematoma. Does not appear to be infected. Not having any compartment syndrome or foot drop associated with his hematoma. - Advise compression and apply warm washcloths. - If any complications then may need to debride the area. - If becomes infected then follow-up and he is starting antibiotics.

## 2017-06-21 NOTE — Patient Instructions (Addendum)
Thank you for coming in,   Please try to have compression on the area. Please follow up with me if the pain is worse or you feel like it has become infected.    Please feel free to call with any questions or concerns at any time, at [phone]. --Dr. Raeford Razor

## 2017-06-21 NOTE — Progress Notes (Signed)
[name] - 76 y.o. female MRN [id number]  Date of birth: [birth year]  SUBJECTIVE:  Including CC & ROS.  Chief Complaint  Patient presents with  . Edema    Patient states she hit her left leg on the coffee table about 3 weeks ago and now it is swollen and bruised. it does not hurt to touch    [name] a 76 year old female who is presenting with an injury to her left lower extremity. She reports 3 weeks ago she had an injury to the left lateral extremity. She had some bleeding when this occurred. This area has stopped bleeding but now has gotten hard. Has gotten smaller in nature but is still occurring. She is taking aspirin. Denies any foot drop or numbness or tingling distally. She has no weakness. The area has not gotten larger. There is no pain associated with it. Has not had a take any medications for this.     Review of Systems  Musculoskeletal: Negative for gait problem and myalgias.  Skin: Positive for color change.  Neurological: Negative for weakness and numbness.  Hematological: Bruises/bleeds easily.  otherwise negative.   HISTORY: Past Medical, Surgical, Social, and Family History Reviewed & Updated per EMR.   Pertinent Historical Findings include:  Past Medical History:  Diagnosis Date  . Anxiety   . ECZEMA, HX OF 07/25/2007   psoriasis  . GERD (gastroesophageal reflux disease)   . Hemorrhoids, internal   . Hx: UTI (urinary tract infection)   . Irritable bowel syndrome 08/18/2009  . Necrobiosis lipoidica   . Osteoarthritis   . Osteoporosis   . Ruptured eardrum    childhood  . SYNCOPE 04/21/2009   without recurrence    Past Surgical History:  Procedure Laterality Date  . ABDOMINAL HYSTERECTOMY    . BUNIONECTOMY     with hammertoe correction - '93  . VESICOVAGINAL FISTULA CLOSURE W/ TAH     with BSO-'82    Allergies  Allergen Reactions  . Pneumococcal Vaccines Rash  . Tetracycline     REACTION: Yeast infection    Family History  Problem  Relation Age of Onset  . Arthritis Mother   . Heart disease Mother   . Hypertension Mother   . Diabetes Brother   . Heart disease Brother        PAD  . Aneurysm Brother   . Aneurysm Father        Ruptured at age 72  . Aneurysm Daughter 88  . Heart attack Paternal Grandfather   . Coronary artery disease Other        paternal kinship  . Diabetes Other        Paternal kinship  . Cancer Neg Hx        colon     Social History   Social History  . Marital status: Divorced    Spouse name: N/A  . Number of children: 2  . Years of education: college   Occupational History  . opthal tech    Social History Main Topics  . Smoking status: Current Every Day Smoker    Packs/day: 1.00    Years: 55.00    Types: Cigarettes  . Smokeless tobacco: Never Used     Comment:  counseled about smoking cessation.  . Alcohol use No     Comment: occasional glass of wine  . Drug use: No  . Sexual activity: No   Other Topics Concern  . Not on file   Social History Narrative  [employer], [employer] 2 year associates degree. Married  '61 -Divorced after 38 yrs marriage. 1 dtr - '66; 1 son '68,2 grandchildren. Work - Tree surgeon - retired. ACP - CPR yes, yes for short term mechanical ventilation. No prolonged heroic measures. Provided Packet.           PHYSICAL EXAM:  VS: BP 138/82 (BP Location: Left Arm, Patient Position: Sitting, Cuff Size: Normal)   Pulse 86   Temp 97.6 F (36.4 C) (Oral)   Ht 5\' 8"  (1.727 m)   Wt 111 lb (50.3 kg)   SpO2 98%   BMI 16.88 kg/m  Physical Exam Gen: NAD, alert, cooperative with exam,  ENT: normal lips, normal nasal mucosa,  Eye: normal EOM, normal conjunctiva and lids CV:  no edema, +2 pedal pulses   Resp: no accessory muscle use, non-labored,  Skin: no rashes, no areas of induration  Neuro: normal tone, normal sensation to touch Psych:  normal insight, alert and oriented MSK:  Left lower extremity: Some mild tenderness to  palpation around the area of hematoma. Ecchymosis present over the left lateral aspect of the mid fibula. No foot drop and normal strength distally. Normal sensation intact. No overlying erythema or draining. Neurovascularly intact.  Limited ultrasound: Left lower extremity:  Hypoechoic change just under the skin to suggest a hematoma. There is no hypervascular uptake over this area.  Summary: Findings consistent with a hematoma  Ultrasound and interpretation by Clearance Coots, MD           ASSESSMENT & PLAN:   I spent 25 minutes with this patient, greater than 50% was face-to-face time counseling regarding the below diagnosis.   Hematoma Findings on ultrasound and exam are consistent with hematoma. Does not appear to be infected. Not having any compartment syndrome or foot drop associated with his hematoma. - Advise compression and apply warm washcloths. - If any complications then may need to debride the area. - If becomes infected then follow-up and he is starting antibiotics.

## 2017-09-23 ENCOUNTER — Other Ambulatory Visit: Payer: Self-pay | Admitting: Internal Medicine

## 2017-09-23 DIAGNOSIS — M818 Other osteoporosis without current pathological fracture: Secondary | ICD-10-CM

## 2017-10-15 ENCOUNTER — Ambulatory Visit: Payer: Medicare Other | Admitting: Internal Medicine

## 2019-06-29 ENCOUNTER — Encounter: Payer: Self-pay | Admitting: Internal Medicine
